# Patient Record
Sex: Male | Born: 1935 | Race: White | Hispanic: No | Marital: Single | Smoking: Former smoker
Health system: Southern US, Community
[De-identification: ages and names within clinical notes are randomized; demographics above are authoritative.]

## PROBLEM LIST (undated history)

## (undated) DIAGNOSIS — I4891 Unspecified atrial fibrillation: Secondary | ICD-10-CM

## (undated) DIAGNOSIS — I639 Cerebral infarction, unspecified: Secondary | ICD-10-CM

## (undated) DIAGNOSIS — C61 Malignant neoplasm of prostate: Secondary | ICD-10-CM

## (undated) DIAGNOSIS — E119 Type 2 diabetes mellitus without complications: Secondary | ICD-10-CM

## (undated) DIAGNOSIS — I10 Essential (primary) hypertension: Secondary | ICD-10-CM

## (undated) DIAGNOSIS — I219 Acute myocardial infarction, unspecified: Secondary | ICD-10-CM

## (undated) DIAGNOSIS — A809 Acute poliomyelitis, unspecified: Secondary | ICD-10-CM

---

## 2020-06-25 ENCOUNTER — Other Ambulatory Visit: Payer: Self-pay

## 2020-06-25 ENCOUNTER — Encounter (HOSPITAL_COMMUNITY): Payer: Self-pay

## 2020-06-25 ENCOUNTER — Emergency Department (HOSPITAL_COMMUNITY): Payer: Medicare Other

## 2020-06-25 ENCOUNTER — Emergency Department (HOSPITAL_COMMUNITY)
Admission: EM | Admit: 2020-06-25 | Discharge: 2020-06-25 | Disposition: A | Discharge status: Home / Self Care | Payer: Medicare Other | Attending: Emergency Medicine | Admitting: Emergency Medicine

## 2020-06-25 DIAGNOSIS — I10 Essential (primary) hypertension: Secondary | ICD-10-CM | POA: Insufficient documentation

## 2020-06-25 DIAGNOSIS — E119 Type 2 diabetes mellitus without complications: Secondary | ICD-10-CM | POA: Diagnosis not present

## 2020-06-25 DIAGNOSIS — Z8673 Personal history of transient ischemic attack (TIA), and cerebral infarction without residual deficits: Secondary | ICD-10-CM | POA: Diagnosis not present

## 2020-06-25 DIAGNOSIS — Z7901 Long term (current) use of anticoagulants: Secondary | ICD-10-CM | POA: Diagnosis not present

## 2020-06-25 DIAGNOSIS — W182XXA Fall in (into) shower or empty bathtub, initial encounter: Secondary | ICD-10-CM | POA: Diagnosis not present

## 2020-06-25 DIAGNOSIS — I4891 Unspecified atrial fibrillation: Secondary | ICD-10-CM | POA: Diagnosis not present

## 2020-06-25 DIAGNOSIS — S80211A Abrasion, right knee, initial encounter: Secondary | ICD-10-CM | POA: Insufficient documentation

## 2020-06-25 DIAGNOSIS — M542 Cervicalgia: Secondary | ICD-10-CM | POA: Diagnosis not present

## 2020-06-25 DIAGNOSIS — Z79899 Other long term (current) drug therapy: Secondary | ICD-10-CM | POA: Insufficient documentation

## 2020-06-25 DIAGNOSIS — M25511 Pain in right shoulder: Secondary | ICD-10-CM | POA: Diagnosis not present

## 2020-06-25 DIAGNOSIS — S0990XA Unspecified injury of head, initial encounter: Secondary | ICD-10-CM | POA: Insufficient documentation

## 2020-06-25 DIAGNOSIS — S8991XA Unspecified injury of right lower leg, initial encounter: Secondary | ICD-10-CM | POA: Diagnosis present

## 2020-06-25 DIAGNOSIS — Z87891 Personal history of nicotine dependence: Secondary | ICD-10-CM | POA: Diagnosis not present

## 2020-06-25 DIAGNOSIS — W19XXXA Unspecified fall, initial encounter: Secondary | ICD-10-CM

## 2020-06-25 DIAGNOSIS — Z8546 Personal history of malignant neoplasm of prostate: Secondary | ICD-10-CM | POA: Insufficient documentation

## 2020-06-25 DIAGNOSIS — I6782 Cerebral ischemia: Secondary | ICD-10-CM | POA: Diagnosis not present

## 2020-06-25 DIAGNOSIS — Y92009 Unspecified place in unspecified non-institutional (private) residence as the place of occurrence of the external cause: Secondary | ICD-10-CM | POA: Insufficient documentation

## 2020-06-25 DIAGNOSIS — T148XXA Other injury of unspecified body region, initial encounter: Secondary | ICD-10-CM

## 2020-06-25 HISTORY — DX: Malignant neoplasm of prostate: C61

## 2020-06-25 HISTORY — DX: Essential (primary) hypertension: I10

## 2020-06-25 HISTORY — DX: Acute myocardial infarction, unspecified: I21.9

## 2020-06-25 HISTORY — DX: Type 2 diabetes mellitus without complications: E11.9

## 2020-06-25 HISTORY — DX: Cerebral infarction, unspecified: I63.9

## 2020-06-25 HISTORY — DX: Acute poliomyelitis, unspecified: A80.9

## 2020-06-25 HISTORY — DX: Unspecified atrial fibrillation: I48.91

## 2020-06-25 MED ORDER — BACITRACIN ZINC 500 UNIT/GM EX OINT
TOPICAL_OINTMENT | Freq: Two times a day (BID) | CUTANEOUS | Status: DC
  Filled 2020-06-25: qty 0.9

## 2020-06-25 NOTE — ED Notes (Signed)
Dressing placed to right knee

## 2020-06-25 NOTE — ED Notes (Signed)
Ambulated to patient to door and back 1 person assist with no difficulty

## 2020-06-25 NOTE — ED Provider Notes (Signed)
[facility] DEPT Provider Note   CSN: [id number] Arrival date & time: 06/25/20  6195     History Chief Complaint  Patient presents with  . Fall    Joel Holloway is a 86 y.o. male with a past medical history of prior stroke, A. fib, prior MI, hypertension, diabetes, currently anticoagulated on Xarelto presenting to the ED with a chief complaint of fall.  Patient states that he was in the shower when he fell.  Ever since he had his stroke about 4 years ago states that he feels off balance intermittently when he looks up.  States that he was washing his hair when he slid and fell onto his right side.  Denies any head injury or loss of consciousness.  He has been having right knee, shoulder and neck pain since the fall.  Denies any headache, vision changes, numbness in arms or legs.  States that he remembers the event fully.  States that he is mostly compliant with his medications and his daughter, Marzetta Board make sure that he takes them regularly.  Denies any preceding or current chest pain, shortness of breath, vomiting, diarrhea. He is ambulatory with walker at baseline. HPI     Past Medical History:  Diagnosis Date  . Atrial fibrillation ([place])   . Diabetes mellitus without complication ([place])   . Hypertension   . Myocardial infarction ([place])   . Polio   . Prostate cancer ([place])   . Stroke [facility])     There are no problems to display for this patient.   History reviewed. No pertinent surgical history.     History reviewed. No pertinent family history.  Social History   Tobacco Use  . Smoking status: Former Research scientist (life sciences)  . 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 Authorizing Provider  alfuzosin (UROXATRAL) 10 MG 24 hr tablet Take 10 mg by mouth daily. 06/18/20   [provider]  dicyclomine (BENTYL) 20 MG tablet Take 20 mg by mouth 2 (two) times daily. 06/17/20    [provider]  FLUoxetine (PROZAC) 10 MG capsule Take 10 mg by mouth daily. 06/17/20   [provider]  glipiZIDE (GLUCOTROL) 5 MG tablet Take 5 mg by mouth daily. 06/18/20   [provider]  levETIRAcetam (KEPPRA) 500 MG tablet Take 500 mg by mouth 2 (two) times daily. 06/18/20   [provider]  levothyroxine (SYNTHROID) 25 MCG tablet Take 25 mcg by mouth every morning. 06/18/20   [provider]  metoprolol succinate (TOPROL-XL) 100 MG 24 hr tablet Take 100 mg by mouth 2 (two) times daily. 06/18/20   [provider]  simvastatin (ZOCOR) 10 MG tablet Take 10 mg by mouth daily. 06/18/20   [provider]  XARELTO 15 MG TABS tablet Take 15 mg by mouth daily. 05/05/20   [provider]    Allergies    Patient has no known allergies.  Review of Systems   Review of Systems  Constitutional: Negative for appetite change, chills and fever.  HENT: Negative for ear pain, rhinorrhea, sneezing and sore throat.   Eyes: Negative for photophobia and visual disturbance.  Respiratory: Negative for cough, chest tightness, shortness of breath and wheezing.   Cardiovascular: Negative for chest pain and palpitations.  Gastrointestinal: Negative for abdominal pain, blood in stool, constipation, diarrhea, nausea and vomiting.  Genitourinary: Negative for dysuria, hematuria and urgency.  Musculoskeletal: Positive for arthralgias, myalgias  and neck pain.  Skin: Negative for rash.  Neurological: Negative for dizziness, weakness and light-headedness.    Physical Exam Updated Vital Signs BP 135/68   Pulse (!) 54   Temp 97.7 F (36.5 C) (Oral)   Resp 12   Ht 5\' 9"  (1.753 m)   Wt 99.8 kg   SpO2 97%   BMI 32.49 kg/m   Physical Exam Vitals and nursing note reviewed.  Constitutional:      General: He is not in acute distress.    Appearance: He is well-developed and well-nourished.  HENT:     Head: Normocephalic and atraumatic.      Nose: Nose normal.  Eyes:     General: No scleral icterus.       Left eye: No discharge.     Extraocular Movements: EOM normal.     Conjunctiva/sclera: Conjunctivae normal.  Cardiovascular:     Rate and Rhythm: Normal rate and regular rhythm.     Pulses: Intact distal pulses.     Heart sounds: Normal heart sounds. No murmur heard. No friction rub. No gallop.   Pulmonary:     Effort: Pulmonary effort is normal. No respiratory distress.     Breath sounds: Normal breath sounds.  Abdominal:     General: Bowel sounds are normal. There is no distension.     Palpations: Abdomen is soft.     Tenderness: There is no abdominal tenderness. There is no guarding.  Musculoskeletal:        General: Tenderness present. No edema. Normal range of motion.     Cervical back: Normal range of motion and neck supple.     Comments: Tenderness to palpation of posterior right shoulder without changes to range of motion or deformities.  No overlying skin changes. Tenderness to palpation of right knee without changes to range of motion.  2+ DP pulses 2+ radial pulses noted bilaterally. Tenderness to palpation of the right paraspinal musculature of the C-spine. No step-off palpated. No visible bruising, edema or temperature change noted. No objective signs of numbness present. No saddle anesthesia. 2+ DP pulses bilaterally. Sensation intact to light touch. Strength 5/5 in bilateral lower extremities.  Skin:    General: Skin is warm and dry.     Findings: Abrasion present. No rash.     Comments: Abrasion noted to right knee.  Neurological:     Mental Status: He is alert.     Cranial Nerves: No cranial nerve deficit.     Sensory: No sensory deficit.     Motor: No abnormal muscle tone.     Coordination: Coordination normal.     Comments: Pupils reactive. No facial asymmetry noted. Cranial nerves appear grossly intact. Sensation intact to light touch on face, BUE and BLE.   Psychiatric:        Mood and Affect:  Mood and affect normal.     ED Results / Procedures / Treatments   Labs (all labs ordered are listed, but only abnormal results are displayed) Labs Reviewed - No data to display  EKG None  Radiology DG Shoulder Right  Result Date: 06/25/2020 CLINICAL DATA:  Fall in shower this morning with shoulder pain, initial encounter EXAM: RIGHT SHOULDER - 2+ VIEW COMPARISON:  None. FINDINGS: Mild degenerative changes of the acromioclavicular joint are noted. No acute fracture or dislocation is seen. No soft tissue abnormality is noted. IMPRESSION: Degenerative change without acute abnormality. Electronically Signed   By: Inez Catalina M.D.   On: 06/25/2020 07:44  CT Head Wo Contrast  Result Date: 06/25/2020 CLINICAL DATA:  Recent fall in shower while on blood thinners EXAM: CT HEAD WITHOUT CONTRAST CT CERVICAL SPINE WITHOUT CONTRAST TECHNIQUE: Multidetector CT imaging of the head and cervical spine was performed following the standard protocol without intravenous contrast. Multiplanar CT image reconstructions of the cervical spine were also generated. COMPARISON:  None. FINDINGS: CT HEAD FINDINGS Brain: Generalized atrophic changes are noted. Chronic white matter ischemic change is seen as well as findings of prior right MCA infarct. No acute hemorrhage, acute infarction or space-occupying mass lesion are seen. Vascular: No hyperdense vessel or unexpected calcification. Skull: Normal. Negative for fracture or focal lesion. Sinuses/Orbits: No acute finding. Other: None CT CERVICAL SPINE FINDINGS Alignment: Within normal limits. Skull base and vertebrae: 7 cervical segments are well visualized. Vertebral body height is well maintained. No acute fracture or acute facet abnormality is noted. Facet hypertrophic changes are noted at multiple levels primarily on the left. Mild osteophytic changes are noted as well. Soft tissues and spinal canal: Surrounding soft tissue structures show vascular calcifications. No  other focal abnormality is noted. Upper chest: Visualized lung apices are within normal limits. Other: None IMPRESSION: CT of the head: Chronic atrophic and ischemic changes without acute abnormality. CT of the cervical spine: Multilevel degenerative changes of the cervical spine without acute abnormality. Electronically Signed   By: Inez Catalina M.D.   On: 06/25/2020 08:06   CT Cervical Spine Wo Contrast  Result Date: 06/25/2020 CLINICAL DATA:  Recent fall in shower while on blood thinners EXAM: CT HEAD WITHOUT CONTRAST CT CERVICAL SPINE WITHOUT CONTRAST TECHNIQUE: Multidetector CT imaging of the head and cervical spine was performed following the standard protocol without intravenous contrast. Multiplanar CT image reconstructions of the cervical spine were also generated. COMPARISON:  None. FINDINGS: CT HEAD FINDINGS Brain: Generalized atrophic changes are noted. Chronic white matter ischemic change is seen as well as findings of prior right MCA infarct. No acute hemorrhage, acute infarction or space-occupying mass lesion are seen. Vascular: No hyperdense vessel or unexpected calcification. Skull: Normal. Negative for fracture or focal lesion. Sinuses/Orbits: No acute finding. Other: None CT CERVICAL SPINE FINDINGS Alignment: Within normal limits. Skull base and vertebrae: 7 cervical segments are well visualized. Vertebral body height is well maintained. No acute fracture or acute facet abnormality is noted. Facet hypertrophic changes are noted at multiple levels primarily on the left. Mild osteophytic changes are noted as well. Soft tissues and spinal canal: Surrounding soft tissue structures show vascular calcifications. No other focal abnormality is noted. Upper chest: Visualized lung apices are within normal limits. Other: None IMPRESSION: CT of the head: Chronic atrophic and ischemic changes without acute abnormality. CT of the cervical spine: Multilevel degenerative changes of the cervical spine without  acute abnormality. Electronically Signed   By: Inez Catalina M.D.   On: 06/25/2020 08:06   DG Knee Complete 4 Views Right  Result Date: 06/25/2020 CLINICAL DATA:  Right knee pain following fall in shower, initial encounter EXAM: RIGHT KNEE - COMPLETE 4+ VIEW COMPARISON:  None. FINDINGS: No acute fracture or dislocation is noted. Small exostosis is noted along the medial aspect of the proximal tibia. No soft tissue abnormality is seen. IMPRESSION: No acute abnormality noted. Electronically Signed   By: Inez Catalina M.D.   On: 06/25/2020 07:45   DG Foot Complete Right  Result Date: 06/25/2020 CLINICAL DATA:  Recent fall with foot pain, initial encounter EXAM: RIGHT FOOT COMPLETE - 3+ VIEW COMPARISON:  None.  FINDINGS: There is flattening of the plantar arch identified. Multiple fixation screws are noted traversing the medial aspect of the tarsal bones and first 2 metatarsals. Fracture of the fixation side plate medially is seen. Fracture of 1 of the larger fixation screws is also noted. These changes may be chronic in nature although no previous images are available. Diffuse tarsal degenerative changes are noted. No acute fracture is seen. IMPRESSION: Postoperative changes with fracture of fixation sideplate and fixation screw although of uncertain chronicity. No acute fracture is noted. Electronically Signed   By: Inez Catalina M.D.   On: 06/25/2020 07:47    Procedures Procedures   Medications Ordered in ED Medications  bacitracin ointment (has no administration in time range)    ED Course  I have reviewed the triage vital signs and the nursing notes.  Pertinent labs & imaging results that were available during my care of the patient were reviewed by me and considered in my medical decision making (see chart for details).    MDM Rules/Calculators/A&P                          86 year old male with past medical history of prior stroke, A. fib, prior MI, hypertension, diabetes currently  anticoagulated on Xarelto presenting to the ED with chief complaint of fall.  States that ever since he had a stroke about 4 years ago he feels off balance intermittently when he looks up.  Was in the shower washing his hair when he had a similar sensation.  This caused him to fall and land on his right side.  Denies any head injury or loss of consciousness.  Reports right knee pain, right foot pain, right neck pain and shoulder pain.  Denies any vision changes, headache, numbness in arms or legs.  Reports compliance with his home medications with the help of his daughter Marzetta Board.  Denies any preceding or current chest pain or shortness of breath.  On exam there is an abrasion on the right knee noted.  Tenderness of the right shoulder without any overlying skin changes or changes to range of motion.  Normal range of motion of right knee noted.  No focal tenderness of foot.  Equal intact distal pulses noted of bilateral upper and lower extremities.  No weakness or numbness noted on exam.  No facial asymmetry.  X-ray of the shoulder, knee without any abnormalities.  X-ray of the foot shows possible fracture of 1 fixation screw although this is unsure if it is acute or chronic.  CT of the head and cervical spine without any abnormalities. No focal tenderness noted of foot.  Ambulated here without difficulty so I feel this screw fracture may be chronic.  We will have him follow-up with orthopedist.  In the meantime we will have him continue his home medications, follow-up with PCP and return for worsening symptoms.   Patient is hemodynamically stable, in NAD, and able to ambulate in the ED. Evaluation does not show pathology that would require ongoing emergent intervention or inpatient treatment. I explained the diagnosis to the patient. Pain has been managed and has no complaints prior to discharge. Patient is comfortable with above plan and is stable for discharge at this time. All questions were answered prior to  disposition. Strict return precautions for returning to the ED were discussed. Encouraged follow up with PCP.   An After Visit Summary was printed and given to the patient.   Portions of this note  were generated with [employer]. Dictation errors may occur despite best attempts at proofreading.  Final Clinical Impression(s) / ED Diagnoses Final diagnoses:  Fall in home, initial encounter  Skin abrasion    Rx / DC Orders ED Discharge Orders    None       Delia Heady, PA-C 06/25/20 6256    Luna Fuse, MD 06/25/20 [phone]

## 2020-06-25 NOTE — Discharge Instructions (Signed)
Continue your home medications as previously prescribed. Follow-up with the orthopedist regarding the broken screw in your right foot which appears chronic. Follow-up with your primary care provider as well. Return to the ER if you start to experience worsening pain, chest pain, shortness of breath, uncontrollable vomiting.

## 2020-06-25 NOTE — ED Triage Notes (Signed)
Patient is taking per EMS: -Alfuzosin -Fluoxetine -Glipizide -Keppra -Metoprolol -Simvastatin -Xarelto

## 2020-06-25 NOTE — ED Triage Notes (Signed)
Patient BIB [facility] from [facility] facility after falling in the shower. Patient is on [place]. Patient denies losing consciousness Patient hurt his right shoulder, right knee, right foot. Patient is diabetic.   EMS vitals CBG 215 127/63 HR 55 99% Room Air RR 18

## 2020-07-03 ENCOUNTER — Emergency Department (HOSPITAL_COMMUNITY)
Admission: EM | Admit: 2020-07-03 | Discharge: 2020-07-03 | Disposition: A | Discharge status: Home / Self Care | Payer: Medicare Other | Attending: Emergency Medicine | Admitting: Emergency Medicine

## 2020-07-03 ENCOUNTER — Emergency Department (HOSPITAL_COMMUNITY): Payer: Medicare Other

## 2020-07-03 ENCOUNTER — Emergency Department (HOSPITAL_BASED_OUTPATIENT_CLINIC_OR_DEPARTMENT_OTHER): Payer: Medicare Other

## 2020-07-03 ENCOUNTER — Encounter (HOSPITAL_COMMUNITY): Payer: Self-pay | Admitting: Emergency Medicine

## 2020-07-03 ENCOUNTER — Other Ambulatory Visit: Payer: Self-pay

## 2020-07-03 DIAGNOSIS — Z8546 Personal history of malignant neoplasm of prostate: Secondary | ICD-10-CM | POA: Diagnosis not present

## 2020-07-03 DIAGNOSIS — W19XXXA Unspecified fall, initial encounter: Secondary | ICD-10-CM | POA: Insufficient documentation

## 2020-07-03 DIAGNOSIS — M7989 Other specified soft tissue disorders: Secondary | ICD-10-CM | POA: Diagnosis not present

## 2020-07-03 DIAGNOSIS — Z7901 Long term (current) use of anticoagulants: Secondary | ICD-10-CM | POA: Insufficient documentation

## 2020-07-03 DIAGNOSIS — S8011XA Contusion of right lower leg, initial encounter: Secondary | ICD-10-CM | POA: Diagnosis not present

## 2020-07-03 DIAGNOSIS — N189 Chronic kidney disease, unspecified: Secondary | ICD-10-CM | POA: Insufficient documentation

## 2020-07-03 DIAGNOSIS — R600 Localized edema: Secondary | ICD-10-CM | POA: Insufficient documentation

## 2020-07-03 DIAGNOSIS — D649 Anemia, unspecified: Secondary | ICD-10-CM

## 2020-07-03 DIAGNOSIS — D631 Anemia in chronic kidney disease: Secondary | ICD-10-CM | POA: Insufficient documentation

## 2020-07-03 DIAGNOSIS — I129 Hypertensive chronic kidney disease with stage 1 through stage 4 chronic kidney disease, or unspecified chronic kidney disease: Secondary | ICD-10-CM | POA: Insufficient documentation

## 2020-07-03 DIAGNOSIS — S8991XA Unspecified injury of right lower leg, initial encounter: Secondary | ICD-10-CM | POA: Diagnosis present

## 2020-07-03 DIAGNOSIS — Z87891 Personal history of nicotine dependence: Secondary | ICD-10-CM | POA: Insufficient documentation

## 2020-07-03 DIAGNOSIS — Z79899 Other long term (current) drug therapy: Secondary | ICD-10-CM | POA: Diagnosis not present

## 2020-07-03 DIAGNOSIS — E1122 Type 2 diabetes mellitus with diabetic chronic kidney disease: Secondary | ICD-10-CM | POA: Insufficient documentation

## 2020-07-03 LAB — BASIC METABOLIC PANEL
Anion gap: 8 (ref 5–15)
BUN: 31 mg/dL — ABNORMAL HIGH (ref 8–23)
CO2: 23 mmol/L (ref 22–32)
Calcium: 8.2 mg/dL — ABNORMAL LOW (ref 8.9–10.3)
Chloride: 108 mmol/L (ref 98–111)
Creatinine, Ser: 2.01 mg/dL — ABNORMAL HIGH (ref 0.61–1.24)
GFR, Estimated: 32 mL/min — ABNORMAL LOW (ref >60)
Glucose, Bld: 164 mg/dL — ABNORMAL HIGH (ref 70–99)
Potassium: 4.2 mmol/L (ref 3.5–5.1)
Sodium: 139 mmol/L (ref 135–145)

## 2020-07-03 LAB — CBC WITH DIFFERENTIAL/PLATELET
Abs Immature Granulocytes: 0.08 10*3/uL — ABNORMAL HIGH (ref 0.00–0.07)
Basophils Absolute: 0.1 10*3/uL (ref 0.0–0.1)
Basophils Relative: 1 %
Eosinophils Absolute: 0.4 10*3/uL (ref 0.0–0.5)
Eosinophils Relative: 4 %
HCT: 31 % — ABNORMAL LOW (ref 39.0–52.0)
Hemoglobin: 9.6 g/dL — ABNORMAL LOW (ref 13.0–17.0)
Immature Granulocytes: 1 %
Lymphocytes Relative: 13 %
Lymphs Abs: 1.4 10*3/uL (ref 0.7–4.0)
MCH: 28.1 pg (ref 26.0–34.0)
MCHC: 31 g/dL (ref 30.0–36.0)
MCV: 90.6 fL (ref 80.0–100.0)
Monocytes Absolute: 1.6 10*3/uL — ABNORMAL HIGH (ref 0.1–1.0)
Monocytes Relative: 16 %
Neutro Abs: 6.7 10*3/uL (ref 1.7–7.7)
Neutrophils Relative %: 65 %
Platelets: 514 10*3/uL — ABNORMAL HIGH (ref 150–400)
RBC: 3.42 MIL/uL — ABNORMAL LOW (ref 4.22–5.81)
RDW: 17.9 % — ABNORMAL HIGH (ref 11.5–15.5)
WBC: 10.3 10*3/uL (ref 4.0–10.5)
nRBC: 0 % (ref 0.0–0.2)

## 2020-07-03 MED ORDER — HYDROCODONE-ACETAMINOPHEN 5-325 MG PO TABS
1.0000 | ORAL_TABLET | Freq: Once | ORAL | Status: AC
Start: 2020-07-03 — End: 2020-07-03
  Administered 2020-07-03: 1 via ORAL
  Filled 2020-07-03: qty 1

## 2020-07-03 MED ORDER — HYDROCODONE-ACETAMINOPHEN 5-325 MG PO TABS: 1.0000 | ORAL_TABLET | Freq: Four times a day (QID) | ORAL | 0 refills | Status: DC | PRN

## 2020-07-03 NOTE — ED Provider Notes (Signed)
Central DEPT Provider Note   CSN: [id number] Arrival date & time: 07/03/20  1016     History Chief complaint: Leg swelling  General Wearing is a 86 y.o. male.  HPI   Patient presents to the ED for evaluation of right leg swelling.  Patient states he fell injuring his right leg last week.  He subsequently developed swelling in his right lower leg below the knee.  It is tender to the touch.  He is not having any trouble with fevers or chills.  No vomiting or diarrhea.  No chest pain or shortness of breath.  Patient is currently on Xarelto for atrial fibrillation.  Patient is concerned they may have developed a DVT in his leg so he came to ED for that reason  Past Medical History:  Diagnosis Date   Atrial fibrillation ([place])    Diabetes mellitus without complication ([place])    Hypertension    Myocardial infarction [facility])    Polio    Prostate cancer ([place])    Stroke ([place])     There are no problems to display for this patient.   History reviewed. No pertinent surgical history.     History reviewed. No pertinent family history.  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 Authorizing Provider  alfuzosin (UROXATRAL) 10 MG 24 hr tablet Take 10 mg by mouth every evening. 06/18/20  Yes [provider]  dicyclomine (BENTYL) 20 MG tablet Take 20 mg by mouth daily. 06/17/20  Yes [provider]  FLUoxetine (PROZAC) 10 MG capsule Take 10 mg by mouth daily. 06/17/20  Yes [provider]  glipiZIDE (GLUCOTROL) 5 MG tablet Take 5 mg by mouth daily. 06/18/20  Yes [provider]  HYDROcodone-acetaminophen (NORCO/VICODIN) 5-325 MG tablet Take 1 tablet by mouth every 6 (six) hours as needed for severe pain. 07/03/20  Yes Dorie Rank, MD  levETIRAcetam (KEPPRA) 500 MG tablet  Take 500 mg by mouth 2 (two) times daily. 06/18/20  Yes [provider]  levothyroxine (SYNTHROID) 25 MCG tablet Take 25 mcg by mouth every morning. 06/18/20  Yes [provider]  metoprolol succinate (TOPROL-XL) 100 MG 24 hr tablet Take 100 mg by mouth daily. 06/18/20  Yes [provider]  Probiotic Product (PROBIOTIC PO) Take 1 capsule by mouth daily.   Yes [provider]  simvastatin (ZOCOR) 10 MG tablet Take 10 mg by mouth daily. 06/18/20  Yes [provider]  triamcinolone (KENALOG) 0.1 % Apply topically 2 (two) times daily. 06/09/20  Yes [provider]  XARELTO 15 MG TABS tablet Take 15 mg by mouth daily. 05/05/20  Yes [provider]    Allergies    Patient has no known allergies.  Review of Systems   Review of Systems  All other systems reviewed and are negative.   Physical Exam Updated Vital Signs BP 118/67    Pulse 68    Temp 98 F (36.7 C) (Oral)    Resp 16    SpO2 92%   Physical Exam Vitals and nursing note reviewed.  Constitutional:      General: He is not in acute distress.    Appearance: He is well-developed.  HENT:     Head: Normocephalic and atraumatic.     Right Ear: External ear normal.     Left Ear:  External ear normal.  Eyes:     General: No scleral icterus.       Right eye: No discharge.        Left eye: No discharge.     Conjunctiva/sclera: Conjunctivae normal.  Neck:     Trachea: No tracheal deviation.  Cardiovascular:     Rate and Rhythm: Normal rate and regular rhythm.  Pulmonary:     Effort: Pulmonary effort is normal. No respiratory distress.     Breath sounds: Normal breath sounds. No stridor. No wheezing or rales.  Abdominal:     General: Bowel sounds are normal. There is no distension.     Palpations: Abdomen is soft.     Tenderness: There is no abdominal tenderness. There is no guarding or rebound.  Musculoskeletal:        General: Tenderness present.     Cervical back: Neck  supple.     Right lower leg: Edema present.     Comments: Tense edema below the knee on the right lower extremity, cap refill is brisk distally, sensation intact in the foot, no erythema or increased warmth to the calf  Skin:    General: Skin is warm and dry.     Findings: No rash.  Neurological:     Mental Status: He is alert.     Cranial Nerves: No cranial nerve deficit (no facial droop, extraocular movements intact, no slurred speech).     Sensory: No sensory deficit.     Motor: No abnormal muscle tone or seizure activity.     Coordination: Coordination normal.     ED Results / Procedures / Treatments   Labs (all labs ordered are listed, but only abnormal results are displayed) Labs Reviewed  CBC WITH DIFFERENTIAL/PLATELET - Abnormal; Notable for the following components:      Result Value   RBC 3.42 (*)    Hemoglobin 9.6 (*)    HCT 31.0 (*)    RDW 17.9 (*)    Platelets 514 (*)    Monocytes Absolute 1.6 (*)    Abs Immature Granulocytes 0.08 (*)    All other components within normal limits  BASIC METABOLIC PANEL - Abnormal; Notable for the following components:   Glucose, Bld 164 (*)    BUN 31 (*)    Creatinine, Ser 2.01 (*)    Calcium 8.2 (*)    GFR, Estimated 32 (*)    All other components within normal limits    EKG None  Radiology DG Tibia/Fibula Right  Result Date: 07/03/2020 CLINICAL DATA:  86 year old male status post fall last week with continued pain and swelling. EXAM: RIGHT TIBIA AND FIBULA - 2 VIEW COMPARISON:  None. FINDINGS: Sequelae of previous external fixator device at the tibia. Minimally visible ORIF hardware in cannulated screws in the posterior foot. Preserved alignment at the right knee and ankle. Right tibia and fibula are intact. There is a small medial proximal tibia osteochondroma, inconsequential in this age group. No discrete soft tissue injury. IMPRESSION: 1. No acute fracture or dislocation identified about the right tib-fib. 2. Sequelae of  previous tibia ex-fix device. Minimally visible right foot ORIF hardware. Electronically Signed   By: Genevie Ann M.D.   On: 07/03/2020 11:02   VAS [place] LOWER EXTREMITY VENOUS (DVT) (ONLY [facility] & [facility] 7a-7p)  Result Date: 07/03/2020  Lower Venous DVT Study Indications: Swelling.  Risk Factors: Cancer Prostate Trauma fall x1 week. Anticoagulation: Xarelto. Comparison Study: No previous Performing Technologist: Vonzell Schlatter RVT  Examination Guidelines:  A complete evaluation includes B-mode imaging, spectral Doppler, color Doppler, and power Doppler as needed of all accessible portions of each vessel. Bilateral testing is considered an integral part of a complete examination. Limited examinations for reoccurring indications may be performed as noted. The reflux portion of the exam is performed with the patient in reverse Trendelenburg.  +---------+---------------+---------+-----------+----------+--------------+  RIGHT     Compressibility Phasicity Spontaneity Properties Thrombus Aging  +---------+---------------+---------+-----------+----------+--------------+  CFV       Full                                                             +---------+---------------+---------+-----------+----------+--------------+  SFJ       Full                                                             +---------+---------------+---------+-----------+----------+--------------+  FV Prox   Full                                                             +---------+---------------+---------+-----------+----------+--------------+  FV Mid    Full                                                             +---------+---------------+---------+-----------+----------+--------------+  FV Distal Full                                                             +---------+---------------+---------+-----------+----------+--------------+  PFV       Full                                                              +---------+---------------+---------+-----------+----------+--------------+  POP                                                        Not visualized  +---------+---------------+---------+-----------+----------+--------------+  PTV       Full                                             Prox not  seen   +---------+---------------+---------+-----------+----------+--------------+  PERO      Full                                             Prox not seen   +---------+---------------+---------+-----------+----------+--------------+  Summary: RIGHT: - There is no evidence of deep vein thrombosis in the lower extremity. However, portions of this examination were limited- see technologist comments above.  - No cystic structure found in the popliteal fossa. - Large hematome behind knee and at proximal calf not. Unable to get true measurements due to size.   *See table(s) above for measurements and observations.    Preliminary     Procedures Ultrasound ED Soft Tissue  Date/Time: 07/03/2020 11:24 AM Performed by: Dorie Rank, MD Authorized by: Dorie Rank, MD   Procedure details:    Indications: limb pain     Transverse view:  Visualized   Longitudinal view:  Visualized   Images: archived   Location:    Location: lower extremity     Side:  Right Findings:     no abscess present    no cellulitis present    no foreign body present Comments:     Suspect large hematoma, recent trauma  (No charge for study, formal US ordered)     Medications Ordered in ED Medications  HYDROcodone-acetaminophen (NORCO/VICODIN) 5-325 MG per tablet 1 tablet (1 tablet Oral Given 07/03/20 1220)    ED Course  I have reviewed the triage vital signs and the nursing notes.  Pertinent labs & imaging results that were available during my care of the patient were reviewed by me and considered in my medical decision making (see chart for details).  Clinical Course as of 07/03/20 1435  Sat Jul 03, 2020  1129 Imaging tests  reviewed from prior visit.  Patient stated x-ray today is negative.  Bedside ultrasound suggest hematoma. [JK]  1216 No prior labs available.  Renal insufficiency noted [JK]  1216 Hemoglobin decreased at 9.6.  No prior labs for comparison [JK]  1346 Ultrasound negative for DVT [JK]    Clinical Course User Index [JK] Dorie Rank, MD   MDM Rules/Calculators/A&P                          Patient presented to the ED for evaluation of leg swelling.  Patient was concerned about the possibility of a DVT.  Patient's physical exam was suggestive of hematoma.  He has normal perfusion distally.  No signs of compartment syndrome.  Bedside ultrasound performed and patient did appear to have a large hematoma.  He did have a formal Doppler study and that was negative for DVT.  Hematoma was confirmed.  Patient does have anemia and CKD.  Is possible his anemia could be worse from this hematoma but we do not have any baseline.  Patient's not having any dizziness or weakness.  Patient family states patient does have a history of chronic kidney disease.  They do not know of his baseline creatinine but is not having any other symptoms and I think it safe to have him follow-up with his doctor.  Patient is set to see an orthopedic doctor this coming week.  He may benefit from evacuation of the hematoma but there is no emergent need and he would need to discontinue his Xarelto.  I will have the patient stop  taking his Xarelto.  Follow-up with orthopedics.  Speak with his doctor this coming week when it would be safe to resume. Final Clinical Impression(s) / ED Diagnoses Final diagnoses:  Hematoma of right lower extremity, initial encounter  Chronic kidney disease, unspecified CKD stage  Anemia, unspecified type    Rx / DC Orders ED Discharge Orders         Ordered    HYDROcodone-acetaminophen (NORCO/VICODIN) 5-325 MG tablet  Every 6 hours PRN        07/03/20 1433           Dorie Rank, MD 07/03/20 1437

## 2020-07-03 NOTE — ED Triage Notes (Addendum)
Patient BIBA from [facility]. Patient had an fall last week and has noted right leg swelling since then. Denies hitting head or LOC. EMS reports the leg is warm and swollen. Hx a. Fib, DM2, prostate cancer, CVA  BP 113/55 SpO2 98% RA RR 16 CBG 182 HR 82

## 2020-07-03 NOTE — Progress Notes (Signed)
Right lower extremity venous study completed.   Results given to Dr Tomi Bamberger   Please see CV Proc for preliminary results.   Vonzell Schlatter, RVT

## 2020-07-03 NOTE — Progress Notes (Signed)
.  Transition of Care [facility]) - Emergency Department Mini Assessment   Patient Details  Name: Natalie Leclaire MRN: [id number] Date of Birth: 1934/05/27  Transition of Care [facility]) CM/SW Contact:    Erenest Rasher, RN Phone Number: [phone] 07/03/2020, 4:38 PM   Clinical Narrative:  TOC CM spoke to pt's dtr, Jeani Hawking. Pt lives independently at [facility]. Has RW at home. Pt had 2 ED visits in month of March. ED provider updated and [facility] ordered. Offered choice for [facility]. Dtr agreeable to agency that will accept insurance. Contacted [facility] and they will work on start of care for tomorrow or Monday.    ED Mini Assessment: What brought you to the Emergency Department? : leg swelling  Barriers to Discharge: No Barriers Identified  Barrier interventions: arranged [place] of departure: Ambulance  Interventions which prevented an admission or readmission: [place] or Services    Patient Contact and Communications Key Contact 1: Ethel Rana   Spoke with: daughter Contact Date: 07/03/20,   Contact time: 1637 Contact Phone Number: [phone]    Patient states their goals for this hospitalization and ongoing recovery are:: live independently CMS Medicare.gov Compare Post Acute Care list provided to:: Patient Represenative (must comment) (daughter-Lynn) Choice offered to / list presented to : Adult Children  Admission diagnosis:  swelling rt leg There are no problems to display for this patient.  PCP:  [name], DO Pharmacy:   [facility] [place] [place] [address] Phone: [phone] Fax: [phone]

## 2020-07-03 NOTE — Discharge Instructions (Signed)
Try to keep your leg elevated to help reduce swelling.  Take the medications for pain as needed.  Follow-up with your orthopedic doctor this week to discuss further treatment of the hematoma.   Stop taking your Xarelto this week and discuss with your doctor when to restart that medication.  Follow-up with your doctor to review the laboratory tests from today.

## 2020-07-03 NOTE — ED Notes (Signed)
[facility] called to transport patient back to [place]. Patient's family member is arranging to have someone to meet [facility] when patient arrives home.

## 2020-09-20 ENCOUNTER — Emergency Department (HOSPITAL_COMMUNITY): Payer: Medicare Other

## 2020-09-20 ENCOUNTER — Inpatient Hospital Stay (HOSPITAL_COMMUNITY)
Admission: EM | Admit: 2020-09-20 | Discharge: 2020-10-22 | DRG: 177 | Disposition: E | Payer: Medicare Other | Attending: Internal Medicine | Admitting: Internal Medicine

## 2020-09-20 ENCOUNTER — Other Ambulatory Visit: Payer: Self-pay

## 2020-09-20 ENCOUNTER — Encounter (HOSPITAL_COMMUNITY): Payer: Self-pay

## 2020-09-20 DIAGNOSIS — Z7901 Long term (current) use of anticoagulants: Secondary | ICD-10-CM

## 2020-09-20 DIAGNOSIS — Z6832 Body mass index (BMI) 32.0-32.9, adult: Secondary | ICD-10-CM

## 2020-09-20 DIAGNOSIS — F05 Delirium due to known physiological condition: Secondary | ICD-10-CM | POA: Diagnosis not present

## 2020-09-20 DIAGNOSIS — I4891 Unspecified atrial fibrillation: Secondary | ICD-10-CM | POA: Diagnosis present

## 2020-09-20 DIAGNOSIS — Z8546 Personal history of malignant neoplasm of prostate: Secondary | ICD-10-CM | POA: Diagnosis not present

## 2020-09-20 DIAGNOSIS — U071 COVID-19: Secondary | ICD-10-CM | POA: Diagnosis present

## 2020-09-20 DIAGNOSIS — I252 Old myocardial infarction: Secondary | ICD-10-CM

## 2020-09-20 DIAGNOSIS — J9601 Acute respiratory failure with hypoxia: Secondary | ICD-10-CM | POA: Diagnosis present

## 2020-09-20 DIAGNOSIS — Z87891 Personal history of nicotine dependence: Secondary | ICD-10-CM

## 2020-09-20 DIAGNOSIS — I480 Paroxysmal atrial fibrillation: Secondary | ICD-10-CM | POA: Diagnosis present

## 2020-09-20 DIAGNOSIS — Z66 Do not resuscitate: Secondary | ICD-10-CM | POA: Diagnosis present

## 2020-09-20 DIAGNOSIS — I129 Hypertensive chronic kidney disease with stage 1 through stage 4 chronic kidney disease, or unspecified chronic kidney disease: Secondary | ICD-10-CM | POA: Diagnosis present

## 2020-09-20 DIAGNOSIS — E1122 Type 2 diabetes mellitus with diabetic chronic kidney disease: Secondary | ICD-10-CM | POA: Diagnosis present

## 2020-09-20 DIAGNOSIS — E785 Hyperlipidemia, unspecified: Secondary | ICD-10-CM | POA: Diagnosis present

## 2020-09-20 DIAGNOSIS — N1832 Chronic kidney disease, stage 3b: Secondary | ICD-10-CM | POA: Diagnosis present

## 2020-09-20 DIAGNOSIS — R0902 Hypoxemia: Secondary | ICD-10-CM

## 2020-09-20 DIAGNOSIS — Z8673 Personal history of transient ischemic attack (TIA), and cerebral infarction without residual deficits: Secondary | ICD-10-CM

## 2020-09-20 DIAGNOSIS — R54 Age-related physical debility: Secondary | ICD-10-CM | POA: Diagnosis present

## 2020-09-20 DIAGNOSIS — I2510 Atherosclerotic heart disease of native coronary artery without angina pectoris: Secondary | ICD-10-CM | POA: Diagnosis present

## 2020-09-20 DIAGNOSIS — E119 Type 2 diabetes mellitus without complications: Secondary | ICD-10-CM

## 2020-09-20 DIAGNOSIS — J1282 Pneumonia due to coronavirus disease 2019: Secondary | ICD-10-CM | POA: Diagnosis present

## 2020-09-20 DIAGNOSIS — I639 Cerebral infarction, unspecified: Secondary | ICD-10-CM | POA: Diagnosis present

## 2020-09-20 DIAGNOSIS — Z79890 Hormone replacement therapy (postmenopausal): Secondary | ICD-10-CM

## 2020-09-20 DIAGNOSIS — Z515 Encounter for palliative care: Secondary | ICD-10-CM | POA: Diagnosis not present

## 2020-09-20 DIAGNOSIS — Z7984 Long term (current) use of oral hypoglycemic drugs: Secondary | ICD-10-CM

## 2020-09-20 DIAGNOSIS — G9341 Metabolic encephalopathy: Secondary | ICD-10-CM | POA: Diagnosis not present

## 2020-09-20 DIAGNOSIS — I219 Acute myocardial infarction, unspecified: Secondary | ICD-10-CM | POA: Insufficient documentation

## 2020-09-20 DIAGNOSIS — I10 Essential (primary) hypertension: Secondary | ICD-10-CM | POA: Diagnosis present

## 2020-09-20 DIAGNOSIS — Z79899 Other long term (current) drug therapy: Secondary | ICD-10-CM | POA: Diagnosis not present

## 2020-09-20 DIAGNOSIS — R410 Disorientation, unspecified: Secondary | ICD-10-CM | POA: Diagnosis not present

## 2020-09-20 DIAGNOSIS — C61 Malignant neoplasm of prostate: Secondary | ICD-10-CM | POA: Diagnosis present

## 2020-09-20 LAB — COMPREHENSIVE METABOLIC PANEL
ALT: 23 U/L (ref 0–44)
AST: 35 U/L (ref 15–41)
Albumin: 2.8 g/dL — ABNORMAL LOW (ref 3.5–5.0)
Alkaline Phosphatase: 65 U/L (ref 38–126)
Anion gap: 13 (ref 5–15)
BUN: 47 mg/dL — ABNORMAL HIGH (ref 8–23)
CO2: 19 mmol/L — ABNORMAL LOW (ref 22–32)
Calcium: 8.3 mg/dL — ABNORMAL LOW (ref 8.9–10.3)
Chloride: 103 mmol/L (ref 98–111)
Creatinine, Ser: 2.2 mg/dL — ABNORMAL HIGH (ref 0.61–1.24)
GFR, Estimated: 29 mL/min — ABNORMAL LOW (ref >60)
Glucose, Bld: 158 mg/dL — ABNORMAL HIGH (ref 70–99)
Potassium: 3.8 mmol/L (ref 3.5–5.1)
Sodium: 135 mmol/L (ref 135–145)
Total Bilirubin: 1.2 mg/dL (ref 0.3–1.2)
Total Protein: 6.9 g/dL (ref 6.5–8.1)

## 2020-09-20 LAB — CBC WITH DIFFERENTIAL/PLATELET
Abs Immature Granulocytes: 0 10*3/uL (ref 0.00–0.07)
Basophils Absolute: 0 10*3/uL (ref 0.0–0.1)
Basophils Relative: 0 %
Eosinophils Absolute: 0 10*3/uL (ref 0.0–0.5)
Eosinophils Relative: 0 %
HCT: 38.3 % — ABNORMAL LOW (ref 39.0–52.0)
Hemoglobin: 12.1 g/dL — ABNORMAL LOW (ref 13.0–17.0)
Immature Granulocytes: 0 %
Lymphocytes Relative: 5 %
Lymphs Abs: 0.5 10*3/uL — ABNORMAL LOW (ref 0.7–4.0)
MCH: 27.3 pg (ref 26.0–34.0)
MCHC: 31.6 g/dL (ref 30.0–36.0)
MCV: 86.3 fL (ref 80.0–100.0)
Monocytes Absolute: 0.8 10*3/uL (ref 0.1–1.0)
Monocytes Relative: 7 %
Neutro Abs: 9.3 10*3/uL — ABNORMAL HIGH (ref 1.7–7.7)
Neutrophils Relative %: 88 %
Platelets: 603 10*3/uL — ABNORMAL HIGH (ref 150–400)
RBC: 4.44 MIL/uL (ref 4.22–5.81)
RDW: 16.7 % — ABNORMAL HIGH (ref 11.5–15.5)
WBC: 10.7 10*3/uL — ABNORMAL HIGH (ref 4.0–10.5)
nRBC: 0 % (ref 0.0–0.2)

## 2020-09-20 LAB — LACTIC ACID, PLASMA
Lactic Acid, Venous: 1.6 mmol/L (ref 0.5–1.9)
Lactic Acid, Venous: 1.5 mmol/L (ref 0.5–1.9)

## 2020-09-20 LAB — CBG MONITORING, ED: Glucose-Capillary: 158 mg/dL — ABNORMAL HIGH (ref 70–99)

## 2020-09-20 LAB — GLUCOSE, CAPILLARY
Glucose-Capillary: 162 mg/dL — ABNORMAL HIGH (ref 70–99)
Glucose-Capillary: 204 mg/dL — ABNORMAL HIGH (ref 70–99)
Glucose-Capillary: 183 mg/dL — ABNORMAL HIGH (ref 70–99)

## 2020-09-20 LAB — FERRITIN: Ferritin: 763 ng/mL — ABNORMAL HIGH (ref 24–336)

## 2020-09-20 LAB — PROTIME-INR
INR: 1.5 — ABNORMAL HIGH (ref 0.8–1.2)
Prothrombin Time: 17.8 seconds — ABNORMAL HIGH (ref 11.4–15.2)

## 2020-09-20 LAB — D-DIMER, QUANTITATIVE: D-Dimer, Quant: 5.56 ug/mL-FEU — ABNORMAL HIGH (ref 0.00–0.50)

## 2020-09-20 LAB — TRIGLYCERIDES: Triglycerides: 79 mg/dL (ref <150)

## 2020-09-20 LAB — C-REACTIVE PROTEIN: CRP: 28.7 mg/dL — ABNORMAL HIGH (ref <1.0)

## 2020-09-20 LAB — LACTATE DEHYDROGENASE: LDH: 259 U/L — ABNORMAL HIGH (ref 98–192)

## 2020-09-20 LAB — FIBRINOGEN: Fibrinogen: >800 mg/dL — ABNORMAL HIGH (ref 210–475)

## 2020-09-20 LAB — PROCALCITONIN: Procalcitonin: 0.72 ng/mL

## 2020-09-20 MED ORDER — SODIUM CHLORIDE 0.9 % IV SOLN
100.0000 mg | Freq: Every day | INTRAVENOUS | Status: AC
  Administered 2020-09-21 – 2020-09-24 (×4): 100 mg via INTRAVENOUS
  Filled 2020-09-20 (×5): qty 20

## 2020-09-20 MED ORDER — SIMVASTATIN 20 MG PO TABS
10.0000 mg | ORAL_TABLET | Freq: Every evening | ORAL | Status: DC
  Administered 2020-09-20 – 2020-09-25 (×6): 10 mg via ORAL
  Filled 2020-09-20 (×6): qty 1

## 2020-09-20 MED ORDER — LEVOTHYROXINE SODIUM 25 MCG PO TABS
25.0000 ug | ORAL_TABLET | Freq: Every day | ORAL | Status: DC
  Administered 2020-09-21 – 2020-09-26 (×6): 25 ug via ORAL
  Filled 2020-09-20 (×6): qty 1

## 2020-09-20 MED ORDER — SODIUM CHLORIDE 0.9% FLUSH: 3.0000 mL | INTRAVENOUS | Status: DC | PRN

## 2020-09-20 MED ORDER — ASCORBIC ACID 500 MG PO TABS
500.0000 mg | ORAL_TABLET | Freq: Every day | ORAL | Status: DC
  Administered 2020-09-20 – 2020-09-26 (×7): 500 mg via ORAL
  Filled 2020-09-20 (×7): qty 1

## 2020-09-20 MED ORDER — METOPROLOL SUCCINATE ER 25 MG PO TB24
100.0000 mg | ORAL_TABLET | Freq: Every morning | ORAL | Status: DC
  Administered 2020-09-21 – 2020-09-26 (×6): 100 mg via ORAL
  Filled 2020-09-20 (×6): qty 4

## 2020-09-20 MED ORDER — ALFUZOSIN HCL ER 10 MG PO TB24
10.0000 mg | ORAL_TABLET | Freq: Every morning | ORAL | Status: DC
  Administered 2020-09-20 – 2020-09-26 (×7): 10 mg via ORAL
  Filled 2020-09-20 (×7): qty 1

## 2020-09-20 MED ORDER — ALBUTEROL SULFATE HFA 108 (90 BASE) MCG/ACT IN AERS
2.0000 | INHALATION_SPRAY | Freq: Once | RESPIRATORY_TRACT | Status: AC
  Administered 2020-09-20: 2 via RESPIRATORY_TRACT
  Filled 2020-09-20: qty 6.7

## 2020-09-20 MED ORDER — OXYCODONE HCL 5 MG PO TABS
5.0000 mg | ORAL_TABLET | ORAL | Status: DC | PRN
  Administered 2020-09-24: 5 mg via ORAL
  Filled 2020-09-20: qty 1

## 2020-09-20 MED ORDER — SODIUM CHLORIDE 0.9 % IV SOLN: 250.0000 mL | INTRAVENOUS | Status: DC | PRN

## 2020-09-20 MED ORDER — ONDANSETRON HCL 4 MG PO TABS
4.0000 mg | ORAL_TABLET | Freq: Four times a day (QID) | ORAL | Status: DC | PRN
  Administered 2020-09-21: 4 mg via ORAL
  Filled 2020-09-20: qty 1

## 2020-09-20 MED ORDER — POLYETHYLENE GLYCOL 3350 17 G PO PACK: 17.0000 g | PACK | Freq: Every day | ORAL | Status: DC | PRN

## 2020-09-20 MED ORDER — DOCUSATE SODIUM 100 MG PO CAPS
100.0000 mg | ORAL_CAPSULE | Freq: Two times a day (BID) | ORAL | Status: DC
  Administered 2020-09-20 – 2020-09-25 (×8): 100 mg via ORAL
  Filled 2020-09-20 (×6): qty 1

## 2020-09-20 MED ORDER — GUAIFENESIN-DM 100-10 MG/5ML PO SYRP
10.0000 mL | ORAL_SOLUTION | ORAL | Status: DC | PRN
  Administered 2020-09-20 – 2020-09-25 (×4): 10 mL via ORAL
  Filled 2020-09-20 (×4): qty 10

## 2020-09-20 MED ORDER — METHYLPREDNISOLONE SODIUM SUCC 125 MG IJ SOLR
50.0000 mg | Freq: Two times a day (BID) | INTRAMUSCULAR | Status: DC
  Administered 2020-09-20 – 2020-09-22 (×5): 50 mg via INTRAVENOUS
  Filled 2020-09-20 (×5): qty 2

## 2020-09-20 MED ORDER — INSULIN ASPART 100 UNIT/ML IJ SOLN
0.0000 [IU] | Freq: Every day | INTRAMUSCULAR | Status: DC
  Administered 2020-09-21: 2 [IU] via SUBCUTANEOUS
  Administered 2020-09-22: 3 [IU] via SUBCUTANEOUS
  Administered 2020-09-23: 4 [IU] via SUBCUTANEOUS
  Administered 2020-09-24: 3 [IU] via SUBCUTANEOUS

## 2020-09-20 MED ORDER — SODIUM CHLORIDE 0.9% FLUSH
3.0000 mL | Freq: Two times a day (BID) | INTRAVENOUS | Status: DC
  Administered 2020-09-20 – 2020-09-26 (×11): 3 mL via INTRAVENOUS

## 2020-09-20 MED ORDER — BISACODYL 5 MG PO TBEC: 5.0000 mg | DELAYED_RELEASE_TABLET | Freq: Every day | ORAL | Status: DC | PRN

## 2020-09-20 MED ORDER — DICYCLOMINE HCL 20 MG PO TABS
20.0000 mg | ORAL_TABLET | Freq: Two times a day (BID) | ORAL | Status: DC
  Administered 2020-09-20 – 2020-09-26 (×12): 20 mg via ORAL
  Filled 2020-09-20 (×14): qty 1

## 2020-09-20 MED ORDER — ACETAMINOPHEN 325 MG PO TABS
650.0000 mg | ORAL_TABLET | Freq: Four times a day (QID) | ORAL | Status: DC | PRN
  Administered 2020-09-20 – 2020-09-21 (×2): 650 mg via ORAL
  Filled 2020-09-20 (×2): qty 2

## 2020-09-20 MED ORDER — SODIUM CHLORIDE 0.9 % IV SOLN
500.0000 mg | INTRAVENOUS | Status: AC
  Administered 2020-09-20 – 2020-09-24 (×5): 500 mg via INTRAVENOUS
  Filled 2020-09-20 (×5): qty 500

## 2020-09-20 MED ORDER — LEVETIRACETAM 500 MG PO TABS
500.0000 mg | ORAL_TABLET | Freq: Every morning | ORAL | Status: DC
  Administered 2020-09-20 – 2020-09-26 (×7): 500 mg via ORAL
  Filled 2020-09-20 (×7): qty 1

## 2020-09-20 MED ORDER — ACETAMINOPHEN 325 MG PO TABS
650.0000 mg | ORAL_TABLET | Freq: Once | ORAL | Status: AC
  Administered 2020-09-20: 650 mg via ORAL
  Filled 2020-09-20: qty 2

## 2020-09-20 MED ORDER — ALBUTEROL SULFATE HFA 108 (90 BASE) MCG/ACT IN AERS
2.0000 | INHALATION_SPRAY | RESPIRATORY_TRACT | Status: DC | PRN
  Administered 2020-09-21 – 2020-09-26 (×4): 2 via RESPIRATORY_TRACT
  Filled 2020-09-20: qty 6.7

## 2020-09-20 MED ORDER — ONDANSETRON HCL 4 MG/2ML IJ SOLN: 4.0000 mg | Freq: Four times a day (QID) | INTRAMUSCULAR | Status: DC | PRN

## 2020-09-20 MED ORDER — RIVAROXABAN 15 MG PO TABS
15.0000 mg | ORAL_TABLET | Freq: Every morning | ORAL | Status: DC
  Administered 2020-09-20 – 2020-09-25 (×6): 15 mg via ORAL
  Filled 2020-09-20 (×6): qty 1

## 2020-09-20 MED ORDER — ZINC SULFATE 220 (50 ZN) MG PO CAPS
220.0000 mg | ORAL_CAPSULE | Freq: Every day | ORAL | Status: DC
  Administered 2020-09-20 – 2020-09-26 (×7): 220 mg via ORAL
  Filled 2020-09-20 (×7): qty 1

## 2020-09-20 MED ORDER — PREDNISONE 20 MG PO TABS: 50.0000 mg | ORAL_TABLET | Freq: Every day | ORAL | Status: DC

## 2020-09-20 MED ORDER — FLEET ENEMA 7-19 GM/118ML RE ENEM: 1.0000 | ENEMA | Freq: Once | RECTAL | Status: DC | PRN

## 2020-09-20 MED ORDER — SODIUM CHLORIDE 0.9 % IV SOLN
200.0000 mg | Freq: Once | INTRAVENOUS | Status: AC
  Administered 2020-09-20: 200 mg via INTRAVENOUS
  Filled 2020-09-20: qty 40

## 2020-09-20 MED ORDER — SODIUM CHLORIDE 0.9 % IV SOLN
2.0000 g | INTRAVENOUS | Status: AC
  Administered 2020-09-20 – 2020-09-24 (×5): 2 g via INTRAVENOUS
  Filled 2020-09-20 (×6): qty 20

## 2020-09-20 MED ORDER — HYDROCOD POLST-CPM POLST ER 10-8 MG/5ML PO SUER
5.0000 mL | Freq: Two times a day (BID) | ORAL | Status: DC | PRN
  Administered 2020-09-26: 5 mL via ORAL
  Filled 2020-09-20 (×2): qty 5

## 2020-09-20 MED ORDER — SODIUM CHLORIDE 0.9% FLUSH
3.0000 mL | Freq: Two times a day (BID) | INTRAVENOUS | Status: DC
  Administered 2020-09-20 – 2020-09-26 (×14): 3 mL via INTRAVENOUS

## 2020-09-20 MED ORDER — FLUOXETINE HCL 10 MG PO CAPS
10.0000 mg | ORAL_CAPSULE | Freq: Every morning | ORAL | Status: DC
  Administered 2020-09-20 – 2020-09-26 (×7): 10 mg via ORAL
  Filled 2020-09-20 (×8): qty 1

## 2020-09-20 MED ORDER — INSULIN ASPART 100 UNIT/ML IJ SOLN
0.0000 [IU] | Freq: Three times a day (TID) | INTRAMUSCULAR | Status: DC
  Administered 2020-09-20: 3 [IU] via SUBCUTANEOUS
  Administered 2020-09-21: 5 [IU] via SUBCUTANEOUS
  Administered 2020-09-21: 15 [IU] via SUBCUTANEOUS
  Administered 2020-09-21: 11 [IU] via SUBCUTANEOUS
  Administered 2020-09-22: 8 [IU] via SUBCUTANEOUS
  Administered 2020-09-22 (×2): 15 [IU] via SUBCUTANEOUS
  Administered 2020-09-23 (×2): 8 [IU] via SUBCUTANEOUS
  Administered 2020-09-23: 3 [IU] via SUBCUTANEOUS
  Administered 2020-09-24 (×2): 11 [IU] via SUBCUTANEOUS
  Administered 2020-09-24 – 2020-09-25 (×2): 8 [IU] via SUBCUTANEOUS
  Administered 2020-09-25: 11 [IU] via SUBCUTANEOUS
  Administered 2020-09-25 – 2020-09-26 (×2): 3 [IU] via SUBCUTANEOUS

## 2020-09-20 NOTE — ED Notes (Signed)
Admitting at bedside currently, pt will move as soon as attending consult is complete. Floor aware orders will be entered asap.

## 2020-09-20 NOTE — ED Provider Notes (Signed)
[facility] EMERGENCY DEPARTMENT Provider Note   CSN: [id number] Arrival date & time: 08/31/2020  9675     History Chief Complaint  Patient presents with  . Weakness  . Covid    Joel Holloway is a 86 y.o. male presenting for generalized weakness and cough.  Patient states he has had generalized weakness and a productive cough.  Diagnosed with COVID 8 days ago at urgent care.  Does not know if he was started on any medication.  Per EMS, patient was found in his recliner, appeared to have been there for an extended period of time.  Was 86% on room air, does not wear oxygen at baseline.  Patient denies history of lung problems.  Patient reports shortness of breath, worse with exertion.  He denies chest pain, nausea, vomiting, abdominal pain.  He has had some mild diarrhea.  Per chart review, patient with a history of A. fib, is on anticoagulation (unclear if warfarin or xarelto), diabetes, hypertension, CAD, polio, prostate cancer.  HPI     Past Medical History:  Diagnosis Date  . Atrial fibrillation ([place])   . Diabetes mellitus without complication ([place])   . Hypertension   . Myocardial infarction ([place])   . Polio   . Prostate cancer ([place])   . Stroke [facility])     There are no problems to display for this patient.   History reviewed. No pertinent surgical history.     History reviewed. No pertinent family history.  Social History   Tobacco Use  . Smoking status: Former Research scientist (life sciences)  . 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 Authorizing Provider  alfuzosin (UROXATRAL) 10 MG 24 hr tablet Take 10 mg by mouth every evening. 06/18/20   [provider]  dicyclomine (BENTYL) 20 MG tablet Take 20 mg by mouth daily. 06/17/20   [provider]  FLUoxetine (PROZAC) 10 MG capsule Take 10 mg by mouth daily. 06/17/20   [provider]  glipiZIDE (GLUCOTROL) 5  MG tablet Take 5 mg by mouth daily. 06/18/20   [provider]  HYDROcodone-acetaminophen (NORCO/VICODIN) 5-325 MG tablet Take 1 tablet by mouth every 6 (six) hours as needed for severe pain. 07/03/20   Dorie Rank, MD  levETIRAcetam (KEPPRA) 500 MG tablet Take 500 mg by mouth 2 (two) times daily. 06/18/20   [provider]  levothyroxine (SYNTHROID) 25 MCG tablet Take 25 mcg by mouth every morning. 06/18/20   [provider]  metoprolol succinate (TOPROL-XL) 100 MG 24 hr tablet Take 100 mg by mouth daily. 06/18/20   [provider]  Probiotic Product (PROBIOTIC PO) Take 1 capsule by mouth daily.    [provider]  simvastatin (ZOCOR) 10 MG tablet Take 10 mg by mouth daily. 06/18/20   [provider]  triamcinolone (KENALOG) 0.1 % Apply topically 2 (two) times daily. 06/09/20   [provider]  XARELTO 15 MG TABS tablet Take 15 mg by mouth daily. 05/05/20   [provider]    Allergies    Patient has no known allergies.  Review of Systems   Review of Systems  Constitutional: Positive for fever.  Respiratory: Positive for cough and shortness of breath.   Gastrointestinal: Positive for diarrhea.  Neurological: Positive for weakness.  All other systems reviewed and are negative.   Physical Exam Updated Vital Signs BP 90/61   Pulse (!) 26   Temp (!) 101.3  F (38.5 C) (Oral)   Resp (!) 31   SpO2 93%   Physical Exam Vitals and nursing note reviewed. Exam conducted with a chaperone present.  Constitutional:      General: He is not in acute distress.    Appearance: He is well-developed.  HENT:     Head: Normocephalic and atraumatic.  Eyes:     Extraocular Movements: Extraocular movements intact.     Conjunctiva/sclera: Conjunctivae normal.     Pupils: Pupils are equal, round, and reactive to light.  Cardiovascular:     Rate and Rhythm: Normal rate and regular rhythm.     Pulses: Normal pulses.  Pulmonary:      Effort: Pulmonary effort is normal. No respiratory distress.     Breath sounds: Wheezing and rhonchi present.     Comments: Crackles in bilateral lower bases. Expiratory wheezes heard. SpO2 91-93% on 3L via [place].  Abdominal:     General: There is no distension.     Palpations: Abdomen is soft. There is no mass.     Tenderness: There is no abdominal tenderness. There is no guarding or rebound.  Genitourinary:    Comments: Stool in depends. Skin of scrotum and perirectal area is erythematous and tender.  Musculoskeletal:        General: Normal range of motion.     Cervical back: Normal range of motion and neck supple.  Skin:    General: Skin is warm and dry.     Capillary Refill: Capillary refill takes less than 2 seconds.  Neurological:     Mental Status: He is alert and oriented to person, place, and time.     ED Results / Procedures / Treatments   Labs (all labs ordered are listed, but only abnormal results are displayed) Labs Reviewed  CBC WITH DIFFERENTIAL/PLATELET - Abnormal; Notable for the following components:      Result Value   WBC 10.7 (*)    Hemoglobin 12.1 (*)    HCT 38.3 (*)    RDW 16.7 (*)    Platelets 603 (*)    Neutro Abs 9.3 (*)    Lymphs Abs 0.5 (*)    All other components within normal limits  COMPREHENSIVE METABOLIC PANEL - Abnormal; Notable for the following components:   CO2 19 (*)    Glucose, Bld 158 (*)    BUN 47 (*)    Creatinine, Ser 2.20 (*)    Calcium 8.3 (*)    Albumin 2.8 (*)    GFR, Estimated 29 (*)    All other components within normal limits  D-DIMER, QUANTITATIVE - Abnormal; Notable for the following components:   D-Dimer, Quant 5.56 (*)    All other components within normal limits  LACTATE DEHYDROGENASE - Abnormal; Notable for the following components:   LDH 259 (*)    All other components within normal limits  FERRITIN - Abnormal; Notable for the following components:   Ferritin 763 (*)    All other components within normal limits   FIBRINOGEN - Abnormal; Notable for the following components:   Fibrinogen >800 (*)    All other components within normal limits  C-REACTIVE PROTEIN - Abnormal; Notable for the following components:   CRP 28.7 (*)    All other components within normal limits  PROTIME-INR - Abnormal; Notable for the following components:   Prothrombin Time 17.8 (*)    INR 1.5 (*)    All other components within normal limits  CBG MONITORING, ED - Abnormal; Notable for  the following components:   Glucose-Capillary 158 (*)    All other components within normal limits  CULTURE, BLOOD (ROUTINE X 2)  CULTURE, BLOOD (ROUTINE X 2)  LACTIC ACID, PLASMA  TRIGLYCERIDES  LACTIC ACID, PLASMA  PROCALCITONIN    EKG EKG Interpretation  Date/Time:  Monday Sep 20 2020 08:52:44 EDT Ventricular Rate:  103 PR Interval:  225 QRS Duration: 93 QT Interval:  345 QTC Calculation: 452 R Axis:   -14 Text Interpretation: Sinus or ectopic atrial tachycardia Multiple premature complexes, vent & supraven Prolonged PR interval Low voltage, precordial leads No previous ECGs available Confirmed by Gareth Morgan ([id number]) on 09/07/2020 9:33:26 AM   Radiology DG Chest Port 1 View  Result Date: 08/24/2020 CLINICAL DATA:  Weakness. Worsening symptoms of COVID-19 infection. Hypoxia. EXAM: PORTABLE CHEST 1 VIEW COMPARISON:  None. FINDINGS: Cardiac silhouette is partly obscured due to low lung volumes, but grossly normal in size. No mediastinal or hilar masses. Bilateral lung base opacities, more apparent on the left, consistent with atelectasis, accentuated by low lung volumes. Remainder of the lungs is clear. No convincing pleural effusion and no pneumothorax. Skeletal structures are demineralized but grossly intact. IMPRESSION: 1. Left greater than right lung base opacities most likely atelectasis with pneumonia also in the differential diagnosis. 2. No other evidence of acute cardiopulmonary disease. No pulmonary edema.  Electronically Signed   By: Lajean Manes M.D.   On: 09/01/2020 09:21    Procedures Procedures   Medications Ordered in ED Medications  acetaminophen (TYLENOL) tablet 650 mg (650 mg Oral Given 08/24/2020 0939)  albuterol (VENTOLIN HFA) 108 (90 Base) MCG/ACT inhaler 2 puff (2 puffs Inhalation Given 09/02/2020 1761)    ED Course  I have reviewed the triage vital signs and the nursing notes.  Pertinent labs & imaging results that were available during my care of the patient were reviewed by me and considered in my medical decision making (see chart for details).    MDM Rules/Calculators/A&P                          Patient presenting for evaluation of shortness of breath, cough, generalized weakness and diarrhea in the setting of positive COVID infection.  On exam, patient appears unkept, but not in acute distress.  Pulmonary exam concerning, patient has rhonchi in bilateral bases and wheezing noted with expiration.  He is hypoxic on room air, improved slightly with nasal cannula.  He is febrile at 101.3.  In the setting of hypoxia, worsening symptoms, and multiple risk factors including age and medical comorbidities, patient would likely benefit from hospitalization for his COVID.  Will order labs and chest x-ray.  Chest x-ray viewed and independently interpreted by me, shows bilateral opacities.  Labs show elevated inflammatory markers consistent with COVID. Will call for admission.   Discussed with Dr. Lorin Mercy and [facility] hospitalist service, patient to be admitted.  Final Clinical Impression(s) / ED Diagnoses Final diagnoses:  COVID-19  Hypoxia    Rx / DC Orders ED Discharge Orders    None       Franchot Heidelberg, PA-C 09/07/2020 1601    Gareth Morgan, MD 09/21/20 1442

## 2020-09-20 NOTE — H&P (Addendum)
History and Physical    Joel Holloway:[id number] DOB: [birth year] DOA: 09/16/2020  PCP: [name], DO Consultants:  None Patient coming from: [place] - lives alone; NOK: Daughter, [name], [name]  Chief Complaint: Worsening COVID symptoms  HPI: [name] is a 86 y.o. male with medical history significant of CVA; prostate CA; CAD; HTN; DM; and afib on [facility] presenting with worsening COVID symptoms.  He was seen in UC on 5/22 with 5 days of sore throat along with mild cough, congestion, rhinorrhea, fatigue.  He is vaccinated but tested + for COVID.  He was not treated and was told to f/u with his PCP.  Since then, he has been increasingly fatigued.  Some SOB and cough were noticeable along with mild GI symptoms, but the fatigue and anorexia were his most prominent symptoms.    ED Course:  COVID.  + 8 days ago, on 5/22.  Mostly worsening weakness, also cough and SOB.  86% on RA with EMS.  Currently on 3L, 91-93%.  CXR with opacities.  Does not appear to have been prescribed MAB therapy at [facility] when diagnosed.  Review of Systems: As per HPI; otherwise review of systems reviewed and negative.    COVID Vaccine Status:   Complete  Past Medical History:  Diagnosis Date  . Atrial fibrillation ([place])   . Diabetes mellitus without complication ([place])   . Hypertension   . Myocardial infarction ([place])   . Polio   . Prostate cancer ([place])   . Stroke [facility])     History reviewed. No pertinent surgical history.  Social History   Socioeconomic History  . Marital status: Single    Spouse name: Not on file  . Number of children: Not on file  . Years of education: Not on file  . Highest education level: Not on file  Occupational History  . Occupation: retired  Tobacco Use  . Smoking status: Former Smoker    Years: 10.00    Quit date: 87    Years since quitting: 60.4  . Smokeless tobacco: Former Network engineer and Sexual Activity  . Alcohol  use: Not Currently  . Drug use: Never  . Sexual activity: Not on file  Other Topics Concern  . Not on file  Social History Narrative  . Not on file   Social Determinants of Health   Financial Resource Strain: Not on file  Food Insecurity: Not on file  Transportation Needs: Not on file  Physical Activity: Not on file  Stress: Not on file  Social Connections: Not on file  Intimate Partner Violence: Not on file    No Known Allergies  History reviewed. No pertinent family history.  Prior to Admission medications   Medication Sig Start Date End Date Taking? Authorizing Provider  alfuzosin (UROXATRAL) 10 MG 24 hr tablet Take 10 mg by mouth every morning. 06/18/20  Yes [provider]  dicyclomine (BENTYL) 20 MG tablet Take 20 mg by mouth 2 (two) times daily. 06/17/20  Yes [provider]  glipiZIDE (GLUCOTROL) 5 MG tablet Take 5 mg by mouth every evening. 06/18/20  Yes [provider]  Rivaroxaban (XARELTO) 15 MG TABS tablet Take 15 mg by mouth every morning.   Yes [provider]  azithromycin (ZITHROMAX) 250 MG tablet Take 250-500 mg by mouth See admin instructions. Take 2 tablets (500 mg) by mouth 1st day, then take 1 tablet (250 mg) daily on days 2-5 09/14/20   [provider]  FLUoxetine (  PROZAC) 10 MG capsule Take 10 mg by mouth daily. 06/17/20   [provider]  HYDROcodone-acetaminophen (NORCO/VICODIN) 5-325 MG tablet Take 1 tablet by mouth every 6 (six) hours as needed for severe pain. Patient not taking: Reported on 08/31/2020 07/03/20   Dorie Rank, MD  levETIRAcetam (KEPPRA) 500 MG tablet Take 500 mg by mouth 2 (two) times daily. 06/18/20   [provider]  levothyroxine (SYNTHROID) 25 MCG tablet Take 25 mcg by mouth every morning. 06/18/20   [provider]  metoprolol succinate (TOPROL-XL) 100 MG 24 hr tablet Take 100 mg by mouth daily. 06/18/20   [provider]  simvastatin (ZOCOR) 10 MG tablet Take 10  mg by mouth daily. 06/18/20   [provider]  triamcinolone (KENALOG) 0.1 % Apply topically 2 (two) times daily. 06/09/20   [provider]    Physical Exam: Vitals:   08/22/2020 1000 09/14/2020 1015 09/16/2020 1030 08/24/2020 1100  BP:   90/61 98/62  Pulse: (!) 103 (!) 108 (!) 26 88  Resp: (!) 29 (!) 28 (!) 31 (!) 28  Temp:      TempSrc:      SpO2: (!) 89% 93% 93% 91%     . General:  Appears calm and comfortable and is in NAD but appears quite fatigued, O2 sats in low 90s on 4.5L [place] O2 . Eyes:  PERRL, EOMI, normal lids, iris . ENT:  grossly normal hearing, lips & tongue, mildly dry mm; appropriate dentition . Neck:  no LAD, masses or thyromegaly . Cardiovascular:  RRR, no m/r/g. No LE edema.  Marland Kitchen Respiratory:   Scattered rhonchi.  Mildly increased respiratory effort. . Abdomen:  soft, NT, ND . Skin:  no rash or induration seen on limited exam . Musculoskeletal:  grossly normal tone BUE/BLE, good ROM, no bony abnormality . Lower extremity:  No LE edema.  Limited foot exam with no ulcerations.  2+ distal pulses. Marland Kitchen Psychiatric:  blunted mood and affect, speech fluent and appropriate, AOx3 . Neurologic:  CN 2-12 grossly intact, moves all extremities in coordinated fashion    Radiological Exams on Admission: Independently reviewed - see discussion in A/P where applicable  DG Chest Port 1 View  Result Date: 09/21/2020 CLINICAL DATA:  Weakness. Worsening symptoms of COVID-19 infection. Hypoxia. EXAM: PORTABLE CHEST 1 VIEW COMPARISON:  None. FINDINGS: Cardiac silhouette is partly obscured due to low lung volumes, but grossly normal in size. No mediastinal or hilar masses. Bilateral lung base opacities, more apparent on the left, consistent with atelectasis, accentuated by low lung volumes. Remainder of the lungs is clear. No convincing pleural effusion and no pneumothorax. Skeletal structures are demineralized but grossly intact. IMPRESSION: 1. Left greater than right lung base  opacities most likely atelectasis with pneumonia also in the differential diagnosis. 2. No other evidence of acute cardiopulmonary disease. No pulmonary edema. Electronically Signed   By: Lajean Manes M.D.   On: 09/16/2020 09:21    EKG: Independently reviewed.  Sinus tachycardia with rate 103; no evidence of acute ischemia   Labs on Admission: I have personally reviewed the available labs and imaging studies at the time of the admission.  Pertinent labs:   CO2 19 BUN 47/Creatinine 2.20/GFR 29 - stable LDH 259 Ferritin 763 CRP 28.7 Lactate 1.6 WBC 10.7 Hgb 12.1 D-dimer 5.56 Fibrinogen >800 INR 1.5   Assessment/Plan Principal Problem:   COVID-19 Active Problems:   Stroke [facility])   Prostate cancer (HCC)   Hypertension   Diabetes mellitus without complication (  [place])   Atrial fibrillation ([place])   Acute respiratory failure with hypoxia due to COVID-19 PNA -Patient without baseline O2 requirement with 5/22 + COVID test presenting with SOB, fatigue, anorexia -He does not have a usual home O2 requirement and is currently requiring 4-5L [place] O2 -COVID POSITIVE -The patient has comorbidities which may increase the risk for ARDS/MODS including: age, HTN, DM, obesity, CAD -Pertinent labs concerning for COVID include increased LDH; markedly elevated D-dimer (>1); increased ferritin; markedly elevated CRP (>7); markedly increased fibrinogen -CXR with multifocal opacities which may be c/w COVID vs. Multifocal PNA -Will treat with broad-spectrum antibiotics given procalcitonin >0.5.   -Will admit for further evaluation, close monitoring, and treatment -Monitor on telemetry x at least 24 hours -At this time, will attempt to avoid use of aerosolized medications and use HFAs instead -Will check daily labs including BMP with Mag, Phos; LFTs; CBC with differential; CRP; ferritin; fibrinogen; D-dimer -Will order steroids and Remdesivir (pharmacy consult) given +COVID test, +CXR, and hypoxia <94% on  room air -If the patient shows clinical deterioration, consider transfer to ICU with PCCM consultation -Consider IL-6 agonist (Actemra) and/or JAK inhibitor (baricitinib) if the patient does not stabilize on current treatment or if the patient has marked clinical decompensation; the patient does not appear to require this treatment at this time but has been consented and agrees to receive treatment if there is evidence of clinical worsening despite current treatments. -Will attempt to maintain euvolemia to a net negative fluid status -Will ask the patient to maintain an awake prone position as much as possible -PT/OT consults -Encourage mobilization/ambulation as much as possible -Patient was seen wearing full PPE including: gown, gloves, head cover, N95, and face shield; donning and doffing was in compliance with current standards.  DM -Will check A1c -May utilize continuous glucose monitoring -hold Glucotrol -Cover with moderate-scale SSI  HTN -Continue Toprol XL  HLD -Continue Zocor  Afib -Rate control with Toprol -Continue Xarelto - although with his renal function at some point it may be reasonable to transition to Eliquis  Stage 3b/4 CKD -Appears to be similar to prior ER visit on 3/12 -Needs ongoing monitoring  Obesity -BMI is 32.5 -Weight loss should be encouraged -Outpatient PCP/bariatric medicine f/u encouraged    *His daughter is concerned that he might need SNF after hospitalization; he tends to decompensate quickly and tends to be slow to rebound.*    Level of care: Telemetry Medical DVT prophylaxis:  Lovenox  Code Status:  DNR - confirmed with patient Family Communication: None present; I spoke with the patient's daughter by telephone. Disposition Plan:  The patient is from: home  Anticipated d/c is to: home without [facility] services once his respiratory issues have been resolved.  He may require home O2 at the time of discharge.  Anticipated d/c date will depend  on clinical response to treatment, likely between 3 days (with completion of outpatient Remdesivir treatment) and 5 days  Patient is currently: acutely ill Consults called: PT/OT; TOC team consult  Admission status: Admit - It is my clinical opinion that admission to INPATIENT is reasonable and necessary because of the expectation that this patient will require hospital care that crosses at least 2 midnights to treat this condition based on the medical complexity of the problems presented.  Given the aforementioned information, the predictability of an adverse outcome is felt to be significant.     Karmen Bongo MD [facility]   How to contact the [facility] Attending or Consulting provider  7A - 7P or covering provider during after hours [place], for this patient?  1. Check the care team in [facility] and look for a) attending/consulting TRH provider listed and b) the [facility] team listed 2. Log into [email or web address] and use [place]'s universal password to access. If you do not have the password, please contact the hospital operator. 3. Locate the [facility] provider you are looking for under [facility] and page to a number that you can be directly reached. 4. If you still have difficulty reaching the provider, please page the [facility] (Director on Call) for the Hospitalists listed on amion for assistance.   09/08/2020, 12:43 PM

## 2020-09-20 NOTE — ED Triage Notes (Signed)
Pt BIB [facility] from [place] d/t weakness & worsening Covid symptoms, he was Dx on Thursday (09/16/20). EMS reports pt had been sitting in his recliner for an unknown amt of time with a pungent odor & was 86% on RA. Upon arrival to ED pt was A/Ox4, verbal-able to make needs known, non-productive cough with expiratory wheezing & 101.3 oral Temp.

## 2020-09-21 LAB — CBC WITH DIFFERENTIAL/PLATELET
Abs Immature Granulocytes: 0 10*3/uL (ref 0.00–0.07)
Basophils Absolute: 0 10*3/uL (ref 0.0–0.1)
Basophils Relative: 0 %
Eosinophils Absolute: 0.3 10*3/uL (ref 0.0–0.5)
Eosinophils Relative: 4 %
HCT: 36.3 % — ABNORMAL LOW (ref 39.0–52.0)
Hemoglobin: 11.5 g/dL — ABNORMAL LOW (ref 13.0–17.0)
Lymphocytes Relative: 2 %
Lymphs Abs: 0.2 10*3/uL — ABNORMAL LOW (ref 0.7–4.0)
MCH: 27.4 pg (ref 26.0–34.0)
MCHC: 31.7 g/dL (ref 30.0–36.0)
MCV: 86.4 fL (ref 80.0–100.0)
Monocytes Absolute: 0.2 10*3/uL (ref 0.1–1.0)
Monocytes Relative: 3 %
Neutro Abs: 6.8 10*3/uL (ref 1.7–7.7)
Neutrophils Relative %: 91 %
Platelets: 570 10*3/uL — ABNORMAL HIGH (ref 150–400)
RBC: 4.2 MIL/uL — ABNORMAL LOW (ref 4.22–5.81)
RDW: 16.5 % — ABNORMAL HIGH (ref 11.5–15.5)
WBC: 7.5 10*3/uL (ref 4.0–10.5)
nRBC: 0 % (ref 0.0–0.2)
nRBC: 0 /100 WBC

## 2020-09-21 LAB — COMPREHENSIVE METABOLIC PANEL
ALT: 30 U/L (ref 0–44)
AST: 42 U/L — ABNORMAL HIGH (ref 15–41)
Albumin: 2.6 g/dL — ABNORMAL LOW (ref 3.5–5.0)
Alkaline Phosphatase: 64 U/L (ref 38–126)
Anion gap: 14 (ref 5–15)
BUN: 53 mg/dL — ABNORMAL HIGH (ref 8–23)
CO2: 21 mmol/L — ABNORMAL LOW (ref 22–32)
Calcium: 8.1 mg/dL — ABNORMAL LOW (ref 8.9–10.3)
Chloride: 101 mmol/L (ref 98–111)
Creatinine, Ser: 2.28 mg/dL — ABNORMAL HIGH (ref 0.61–1.24)
GFR, Estimated: 28 mL/min — ABNORMAL LOW (ref >60)
Glucose, Bld: 277 mg/dL — ABNORMAL HIGH (ref 70–99)
Potassium: 4 mmol/L (ref 3.5–5.1)
Sodium: 136 mmol/L (ref 135–145)
Total Bilirubin: 0.6 mg/dL (ref 0.3–1.2)
Total Protein: 6.5 g/dL (ref 6.5–8.1)

## 2020-09-21 LAB — GLUCOSE, CAPILLARY
Glucose-Capillary: 239 mg/dL — ABNORMAL HIGH (ref 70–99)
Glucose-Capillary: 352 mg/dL — ABNORMAL HIGH (ref 70–99)
Glucose-Capillary: 325 mg/dL — ABNORMAL HIGH (ref 70–99)
Glucose-Capillary: 208 mg/dL — ABNORMAL HIGH (ref 70–99)

## 2020-09-21 LAB — PHOSPHORUS: Phosphorus: 4.2 mg/dL (ref 2.5–4.6)

## 2020-09-21 LAB — MAGNESIUM: Magnesium: 2.2 mg/dL (ref 1.7–2.4)

## 2020-09-21 LAB — D-DIMER, QUANTITATIVE: D-Dimer, Quant: 4.13 ug/mL-FEU — ABNORMAL HIGH (ref 0.00–0.50)

## 2020-09-21 LAB — FERRITIN: Ferritin: 898 ng/mL — ABNORMAL HIGH (ref 24–336)

## 2020-09-21 LAB — C-REACTIVE PROTEIN: CRP: 28.6 mg/dL — ABNORMAL HIGH (ref <1.0)

## 2020-09-21 LAB — HEMOGLOBIN A1C
Hgb A1c MFr Bld: 5.7 % — ABNORMAL HIGH (ref 4.8–5.6)
Mean Plasma Glucose: 117 mg/dL

## 2020-09-21 NOTE — Evaluation (Signed)
Occupational Therapy Evaluation Patient Details Name: Joel Holloway MRN: [id number] DOB: 1934-10-26 Today's Date: 09/21/2020    History of Present Illness Pt is an 86 yo male s/p COVID.  + 8 days ago, on 5/22.  Mostly worsening weakness, also cough and SOB.  86% on RA with EMS.  Currently on 3L, 91-93%. PMHx: CVA; prostate CA; CAD; HTN; DM; and afib on [facility]   Clinical Impression   Pt PTA: Pt living in ILF (alone) and reports independence; walking to dining hall for meals x2 times daily. Pt limited by decreased strength, decreased ability to care for self, decreased activity tolerance and decreased functional endurance. Pt supervisionA to [place] for OOB ADL; transfers and mobility with minguardA to [place].  Pt fatigues easily and appears slightly confused about situation. Pt's O2 4L >84-90% with exertion with mobility with varying levels of O2 pleth reading. Pt with no physical assist daily from family and would need to be at higher level of care before returning home. Pt coughing incessantly-pt invited to hold blanket to bell when  Pt would benefit from continued OT skilled services. OT following acutely.    Follow Up Recommendations  SNF    Equipment Recommendations  3 in 1 bedside commode    Recommendations for Other Services       Precautions / Restrictions Precautions Precautions: Fall;Other (comment) Precaution Comments: O2 chronic Restrictions Weight Bearing Restrictions: No      Mobility Bed Mobility Overal bed mobility: Needs Assistance Bed Mobility: Supine to Sit;Sit to Supine     Supine to sit: Supervision;HOB elevated Sit to supine: Supervision;HOB elevated   General bed mobility comments: Increased time and use of rail with HOB elevated    Transfers Overall transfer level: Needs assistance Equipment used: Rolling walker (2 wheeled) Transfers: Sit to/from [facility] Sit to Stand: Min guard Stand pivot transfers: Min guard       General  transfer comment: Use of RW for stability    Balance Overall balance assessment: Needs assistance Sitting-balance support: Bilateral upper extremity supported;Feet supported Sitting balance-Leahy Scale: Fair     Standing balance support: Single extremity supported;During functional activity Standing balance-Leahy Scale: Poor Standing balance comment: use of external supports                           ADL either performed or assessed with clinical judgement   ADL Overall ADL's : Needs assistance/impaired Eating/Feeding: Set up;Sitting   Grooming: Set up;Sitting Grooming Details (indicate cue type and reason): Pt standing briefly before deciding to walk back to bed Upper Body Bathing: Set up;Sitting   Lower Body Bathing: Moderate assistance;Cueing for safety;Sitting/lateral leans;Sit to/from stand   Upper Body Dressing : Set up;Sitting   Lower Body Dressing: Moderate assistance;Cueing for safety;Sitting/lateral leans;Sit to/from stand   Toilet Transfer: Min guard;RW;Ambulation;Regular Toilet;Grab bars Armed forces technical officer Details (indicate cue type and reason): use of grab bars; ambulating to commode with RW: stood for toilet hygiene x1 min and required minguardA to ensure that pt was clean. Toileting- Clothing Manipulation and Hygiene: Minimal assistance       Functional mobility during ADLs: Min guard;Rolling walker General ADL Comments: Pt limited by decreased strength, decreased ability to care for self, decreased activity tolerance and decreased functional endurance. O2 4L >84-90% with exertion with mobility with varying levels of O2 pleth reading.     Vision Baseline Vision/History: No visual deficits Patient Visual Report: No change from baseline Vision Assessment?: No apparent visual deficits  Perception     Praxis      Pertinent Vitals/Pain Pain Assessment: 0-10 Pain Score: 5  Pain Location: ribs from coughing Pain Descriptors / Indicators:  Discomfort;Sore Pain Intervention(s): Monitored during session;Repositioned     Hand Dominance Right   Extremity/Trunk Assessment Upper Extremity Assessment Upper Extremity Assessment: Overall WFL for tasks assessed   Lower Extremity Assessment Lower Extremity Assessment: Generalized weakness   Cervical / Trunk Assessment Cervical / Trunk Assessment: Normal   Communication Communication Communication: Expressive difficulties;Other (comment) (soft spoken, breathy voice)   Cognition Arousal/Alertness: Awake/alert Behavior During Therapy: WFL for tasks assessed/performed Overall Cognitive Status: Within Functional Limits for tasks assessed                                 General Comments: A/O x3; unaware of situation.   General Comments  O2 4L >84-90% wtih exertion questionable pleth at times, but pt quickly recovered <1 min with pursed lip breathing.    Exercises     Shoulder Instructions      Home Living Family/patient expects to be discharged to:: Private residence Living Arrangements: Alone Available Help at Discharge: Family;Available PRN/intermittently Type of Home: Independent living facility Home Access: Level entry;Elevator     Home Layout: One level     Bathroom Shower/Tub: Occupational psychologist: Handicapped height     Home Equipment: Environmental consultant - 4 wheels;Shower seat;Bedside commode;Hand held shower head;Grab bars - tub/shower;Grab bars - toilet          Prior Functioning/Environment Level of Independence: Independent with assistive device(s)        Comments: Lives alone in ILF        OT Problem List: Decreased strength;Decreased activity tolerance;Impaired balance (sitting and/or standing);Decreased safety awareness;Decreased cognition;Cardiopulmonary status limiting activity;Obesity;Increased edema      OT Treatment/Interventions: Self-care/ADL training;Therapeutic exercise;Energy conservation;DME and/or AE  instruction;Therapeutic activities;Patient/family education;Balance training    OT Goals(Current goals can be found in the care plan section) Acute Rehab OT Goals Patient Stated Goal: to get better OT Goal Formulation: With patient Time For Goal Achievement: 10/05/20 Potential to Achieve Goals: Good ADL Goals Pt Will Perform Grooming: with modified independence;standing Pt Will Perform Lower Body Dressing: with supervision;sit to/from stand Pt Will Transfer to Toilet: with modified independence;ambulating;regular height toilet Additional ADL Goal #1: Pt will recall 5 items immediately and after 5 mins with minimal cues in 2/3 trials. Additional ADL Goal #2: Pt will perform x5 mins of OOB ADL with minimal rest breaks and O2 sats >90% with exertion.  OT Frequency: Min 2X/week   Barriers to D/C:            Co-evaluation              AM-PAC OT "6 Clicks" Daily Activity     Outcome Measure Help from another person eating meals?: None Help from another person taking care of personal grooming?: A Little Help from another person toileting, which includes using toliet, bedpan, or urinal?: A Little Help from another person bathing (including washing, rinsing, drying)?: A Little Help from another person to put on and taking off regular upper body clothing?: A Little Help from another person to put on and taking off regular lower body clothing?: A Little 6 Click Score: 19   End of Session Equipment Utilized During Treatment: Gait belt;Rolling walker;Oxygen Nurse Communication: Mobility status  Activity Tolerance: Patient limited by fatigue Patient left: in bed;with call bell/phone  within reach;with bed alarm set;Other (comment) (no recliner in room- no extra on unit at time of eval. pt placed in chair position with bed)  OT Visit Diagnosis: Unsteadiness on feet (R26.81);Muscle weakness (generalized) (M62.81)                Time: 4975-3005 OT Time Calculation (min): 39 min Charges:   OT General Charges $OT Visit: 1 Visit OT Evaluation $OT Eval Moderate Complexity: 1 Mod OT Treatments $Self Care/Home Management : 8-22 mins $Therapeutic Activity: 8-22 mins  Jefferey Pica, OTR/L Acute Rehabilitation Services Pager: [phone] Office: [phone]   Robet Crutchfield C 09/21/2020, 2:31 PM

## 2020-09-21 NOTE — Evaluation (Signed)
Physical Therapy Evaluation Patient Details Name: Joel Holloway MRN: [id number] DOB: May 25, 1934 Today's Date: 09/21/2020   History of Present Illness  Pt is an 86 yo male s/p COVID.  + 8 days ago, on 5/22.  Mostly worsening weakness, also cough and SOB.  86% on RA with EMS.  Currently on 3L, 91-93%. PMHx: CVA; prostate CA; CAD; HTN; DM; and afib on AC  Clinical Impression   Patient received in bed, pleasant and cooperative but quite confused. Tells me that he is in [place] in "ho" hospital, and that he is here to please his children and because he has monkeypox. Tends to be quite unsteady when on his feet, and required heavy MinA to maintain balance and safety with RW even when walking short distances in the room. SPO2 pleth was rather unreliable, and he had heavy bouts of coughing however did seem to require as much as 8LPM O2 for activity with pleth 80-88% with activity. Did have a couple of bursts of A-fib as high as 142BPM after activity. Left in bed with all needs met, RN aware of patient status and VSS on 6LPM (resting). Would benefit from skilled PT services in SNF setting at DC.   Follow Up Recommendations SNF;Supervision for mobility/OOB    Equipment Recommendations  Rolling walker with 5" wheels;3in1 (PT);Wheelchair (measurements PT);Wheelchair cushion (measurements PT)    Recommendations for Other Services       Precautions / Restrictions Precautions Precautions: Fall;Other (comment) Precaution Comments: O2 chronic Restrictions Weight Bearing Restrictions: No      Mobility  Bed Mobility Overal bed mobility: Needs Assistance Bed Mobility: Supine to Sit;Sit to Supine     Supine to sit: Min assist;HOB elevated Sit to supine: Supervision;HOB elevated   General bed mobility comments: Increased time and use of rail with HOB elevated, MinA when getting to EOB to boost trunk to midline sitting    Transfers Overall transfer level: Needs assistance Equipment used:  Rolling walker (2 wheeled) Transfers: Sit to/from [facility] Sit to Stand: Min assist Stand pivot transfers: Min guard       General transfer comment: MinA to boost to upright standing and extra time/ongoing MinA to gain balance even with wide BOS  Ambulation/Gait Ambulation/Gait assistance: Min assist Gait Distance (Feet): 12 Feet Assistive device: Rolling walker (2 wheeled) Gait Pattern/deviations: Step-through pattern;Decreased step length - right;Decreased step length - left;Wide base of support;Trunk flexed;Drifts right/left Gait velocity: decreased   General Gait Details: generally unsteady requiring as much as heavy MinA with RW especially as fatigue increased. SPO2 pleth not of great quality, but he did require 8LPM to maintain sats even in the low 80s while walking- dropped to as much as 76% on 6LPM with activity.  Stairs            Wheelchair Mobility    Modified Rankin (Stroke Patients Only)       Balance Overall balance assessment: Needs assistance Sitting-balance support: Bilateral upper extremity supported;Feet supported Sitting balance-Leahy Scale: Fair     Standing balance support: Single extremity supported;During functional activity Standing balance-Leahy Scale: Poor Standing balance comment: use of external supports                             Pertinent Vitals/Pain Pain Assessment: Faces Pain Score: 5  Faces Pain Scale: No hurt Pain Location: ribs from coughing Pain Descriptors / Indicators: Discomfort;Sore Pain Intervention(s): Limited activity within patient's tolerance;Monitored during session    Home  Living Family/patient expects to be discharged to:: Private residence Living Arrangements: Alone Available Help at Discharge: Family;Available PRN/intermittently Type of Home: Independent living facility Home Access: Level entry;Elevator     Home Layout: One level Home Equipment: Walker - 4 wheels;Shower  seat;Bedside commode;Hand held shower head;Grab bars - tub/shower;Grab bars - toilet      Prior Function Level of Independence: Independent with assistive device(s)         Comments: Lives alone in ILF     Hand Dominance   Dominant Hand: Right    Extremity/Trunk Assessment   Upper Extremity Assessment Upper Extremity Assessment: Defer to OT evaluation    Lower Extremity Assessment Lower Extremity Assessment: Generalized weakness    Cervical / Trunk Assessment Cervical / Trunk Assessment: Normal  Communication   Communication: Expressive difficulties;Other (comment) (soft spoken, breathy voice)  Cognition Arousal/Alertness: Awake/alert Behavior During Therapy: WFL for tasks assessed/performed Overall Cognitive Status: Impaired/Different from baseline Area of Impairment: Orientation;Attention;Memory;Following commands;Safety/judgement;Awareness;Problem solving                 Orientation Level: Disoriented to;Situation;Place Current Attention Level: Sustained Memory: Decreased short-term memory Following Commands: Follows one step commands consistently;Follows one step commands with increased time Safety/Judgement: Decreased awareness of safety;Decreased awareness of deficits Awareness: Intellectual Problem Solving: Slow processing;Decreased initiation;Difficulty sequencing;Requires verbal cues General Comments: tells me he is at a hospital in [place] because he wanted to make his children happy, when asked why he is here in the hospital he tells me that he is here because of his monkeypox.      General Comments General comments (skin integrity, edema, etc.): O2 4L >84-90% wtih exertion questionable pleth at times, but pt quickly recovered <1 min with pursed lip breathing.    Exercises     Assessment/Plan    PT Assessment Patient needs continued PT services  PT Problem List Decreased strength;Decreased cognition;Decreased knowledge of use of DME;Decreased  activity tolerance;Decreased safety awareness;Decreased balance;Decreased mobility;Decreased coordination       PT Treatment Interventions DME instruction;Balance training;Gait training;Stair training;Cognitive remediation;Functional mobility training;Patient/family education;Therapeutic exercise;Therapeutic activities    PT Goals (Current goals can be found in the Care Plan section)  Acute Rehab PT Goals Patient Stated Goal: to get better PT Goal Formulation: With patient Time For Goal Achievement: 10/05/20 Potential to Achieve Goals: Fair    Frequency Min 3X/week   Barriers to discharge        Co-evaluation               AM-PAC PT "6 Clicks" Mobility  Outcome Measure Help needed turning from your back to your side while in a flat bed without using bedrails?: A Little Help needed moving from lying on your back to sitting on the side of a flat bed without using bedrails?: A Little Help needed moving to and from a bed to a chair (including a wheelchair)?: A Little Help needed standing up from a chair using your arms (e.g., wheelchair or bedside chair)?: A Little Help needed to walk in hospital room?: A Little Help needed climbing 3-5 steps with a railing? : A Lot 6 Click Score: 17    End of Session   Activity Tolerance: Patient tolerated treatment well Patient left: in bed;with call bell/phone within reach;with bed alarm set;Other (comment) (bed in chair position) Nurse Communication: Mobility status PT Visit Diagnosis: Unsteadiness on feet (R26.81);Difficulty in walking, not elsewhere classified (R26.2);Muscle weakness (generalized) (M62.81)    Time: 2353-6144 PT Time Calculation (min) (ACUTE ONLY): 33 min  Charges:   PT Evaluation $PT Eval Moderate Complexity: 1 Mod PT Treatments $Gait Training: 8-22 mins        Windell Norfolk, DPT, PN1   Supplemental Physical Therapist [place]    Pager [phone] Acute Rehab Office [phone]

## 2020-09-21 NOTE — Progress Notes (Signed)
PROGRESS NOTE    Joel Holloway  CWU:[id number] DOB: 1934/07/31 DOA: 08/26/2020 PCP: [name], DO   Brief Narrative:  Joel Holloway is a 86 y.o. male with medical history significant of CVA; prostate CA; CAD; HTN; DM; and afib on AC presenting with worsening COVID symptoms.   Patient tested positive for COVID approximately 8 days ago with worsening symptoms of respiratory distress with exertion, dyspnea at rest, sore throat, rhinorrhea, cough congestion and generalized fatigue.  He also notes extremely poor p.o. intake over the past 5 to 6 days.  In the ED patient was noted to be 86% on room air at rest requiring upwards of 3 to 4 L nasal cannula to maintain sats above 90%.  It does not appear he had any outpatient treatment for COVID-19 pneumonia prior to admission.  Assessment & Plan:   Principal Problem:   COVID-19 Active Problems:   Stroke [facility])   Prostate cancer ([place])   Hypertension   Diabetes mellitus without complication ([place])   Atrial fibrillation ([place])   Acute respiratory failure with hypoxia due to COVID-19 PNA, POA Cannot rule out concurrent CAP, POA SpO2: 96 % O2 Flow Rate (L/min): 4 L/min - CXR personally reviewed, bibasilar atelectasis versus infiltrate consistent with community-acquired pneumonia > COVID - Continue azith/ceftriaixone for potential underlying CAP given procalcitonin >0.5.   - Continue steroids and Remdesivir per protocol - Patient consented to Actemra/similar if clinically deteriorates - Prone, IS/flutter, early ambulation as tolerated - PT/OT consults  DM, well-controlled non-insulin-dependent Lab Results  Component Value Date   HGBA1C 5.7 (H) 08/28/2020  - Continue sliding scale insulin, hypoglycemic protocol - Hold Glucotrol  HTN - Continue Toprol XL  HLD - Continue Zocor  Afib - Rate control with Toprol - Continue Xarelto -discussed possibility of transition to Eliquis in the near future given CKD 3B  Stage 3b  CKD -Appears to be similar to prior ER visit on 3/12 -Needs ongoing monitoring  Obesity -BMI is 32.5 -Weight loss should be encouraged -Outpatient PCP/bariatric medicine f/u encouraged   DVT prophylaxis:  Lovenox  Code Status:  DNR - confirmed with patient Family Communication: Lengthy discussion with daughter over the phone  Status is: Inpatient  Dispo: The patient is from: Home (PT/HH/Caretaker)              Anticipated d/c is to: Home (resume HH/PT caretaker) vs SNF              Anticipated d/c date is: >72h              Patient currently not medically stable for discharge  Consultants:   None  Procedures:   None  Antimicrobials:  Azithromycin, ceftriaxone x5 days, tentative stop date 09/24/2020  Subjective: No acute issues or events overnight denies nausea vomiting diarrhea constipation headache fevers or chills.  Respiratory status appears to be stabilizing if not minimally improving.  Objective: Vitals:   09/10/2020 2123 09/21/20 0103 09/21/20 0425 09/21/20 1434  BP:  109/67 123/68 113/75  Pulse:  76 79 89  Resp:  (!) 22 19   Temp:  98.1 F (36.7 C) 98 F (36.7 C) 97.8 F (36.6 C)  TempSrc:  Axillary Axillary Oral  SpO2: 98% 97% 96%   Weight:      Height:        Intake/Output Summary (Last 24 hours) at 09/21/2020 1449 Last data filed at 09/21/2020 0241 Gross per 24 hour  Intake 1000.6 ml  Output --  Net 1000.6 ml   Joel Holloway  Weights   08/30/2020 1500  Weight: 97.1 kg    Examination:  General exam: Appears calm and comfortable  Respiratory system: Clear to auscultation. Respiratory effort normal. Cardiovascular system: S1 & S2 heard, RRR. No JVD, murmurs, rubs, gallops or clicks. No pedal edema. Gastrointestinal system: Abdomen is nondistended, soft and nontender. No organomegaly or masses felt. Normal bowel sounds heard. Central nervous system: Alert and oriented. No focal neurological deficits. Extremities: Symmetric 5 x 5 power. Skin: No rashes,  lesions or ulcers Psychiatry: Judgement and insight appear normal. Mood & affect appropriate.     Data Reviewed: I have personally reviewed following labs and imaging studies  CBC: Recent Labs  Lab 08/23/2020 0909 09/21/20 0357  WBC 10.7* 7.5  NEUTROABS 9.3* 6.8  HGB 12.1* 11.5*  HCT 38.3* 36.3*  MCV 86.3 86.4  PLT 603* 696*   Basic Metabolic Panel: Recent Labs  Lab 08/30/2020 0909 09/21/20 0357  NA 135 136  K 3.8 4.0  CL 103 101  CO2 19* 21*  GLUCOSE 158* 277*  BUN 47* 53*  CREATININE 2.20* 2.28*  CALCIUM 8.3* 8.1*  MG  --  2.2  PHOS  --  4.2   GFR: Estimated Creatinine Clearance: 27.7 mL/min (A) (by C-G formula based on SCr of 2.28 mg/dL (H)). Liver Function Tests: Recent Labs  Lab 08/24/2020 0909 09/21/20 0357  AST 35 42*  ALT 23 30  ALKPHOS 65 64  BILITOT 1.2 0.6  PROT 6.9 6.5  ALBUMIN 2.8* 2.6*   No results for input(s): LIPASE, AMYLASE in the last 168 hours. No results for input(s): AMMONIA in the last 168 hours. Coagulation Profile: Recent Labs  Lab 09/16/2020 0909  INR 1.5*   Cardiac Enzymes: No results for input(s): CKTOTAL, CKMB, CKMBINDEX, TROPONINI in the last 168 hours. BNP (last 3 results) No results for input(s): PROBNP in the last 8760 hours. HbA1C: Recent Labs    08/27/2020 0909  HGBA1C 5.7*   CBG: Recent Labs  Lab 09/08/2020 1308 09/17/2020 1640 09/17/2020 2125 09/21/20 0736 09/21/20 1141  GLUCAP 162* 204* 183* 239* 352*   Lipid Profile: Recent Labs    08/30/2020 0909  TRIG 79   Thyroid Function Tests: No results for input(s): TSH, T4TOTAL, FREET4, T3FREE, THYROIDAB in the last 72 hours. Anemia Panel: Recent Labs    09/03/2020 0909 09/21/20 0357  FERRITIN 763* 898*   Sepsis Labs: Recent Labs  Lab 09/19/2020 0909 09/03/2020 1138  PROCALCITON 0.72  --   LATICACIDVEN 1.6 1.5    Recent Results (from the past 240 hour(s))  Blood Culture (routine x 2)     Status: None (Preliminary result)   Collection Time: 09/13/2020  9:05 AM    Specimen: BLOOD  Result Value Ref Range Status   Specimen Description BLOOD BLOOD LEFT WRIST  Final   Special Requests   Final    BOTTLES DRAWN AEROBIC AND ANAEROBIC Blood Culture results may not be optimal due to an excessive volume of blood received in culture bottles   Culture   Final    NO GROWTH < 24 HOURS Performed at [facility], [place] [address], [place], [place] [address]    Report Status PENDING  Incomplete  Blood Culture (routine x 2)     Status: None (Preliminary result)   Collection Time: 08/27/2020  9:10 AM   Specimen: BLOOD  Result Value Ref Range Status   Specimen Description BLOOD BLOOD RIGHT HAND  Final   Special Requests   Final    BOTTLES  DRAWN AEROBIC AND ANAEROBIC Blood Culture adequate volume   Culture   Final    NO GROWTH < 24 HOURS Performed at [facility], [place] [address], [place], [place] [address]    Report Status PENDING  Incomplete         Radiology Studies: DG Chest Port 1 View  Result Date: 08/29/2020 CLINICAL DATA:  Weakness. Worsening symptoms of COVID-19 infection. Hypoxia. EXAM: PORTABLE CHEST 1 VIEW COMPARISON:  None. FINDINGS: Cardiac silhouette is partly obscured due to low lung volumes, but grossly normal in size. No mediastinal or hilar masses. Bilateral lung base opacities, more apparent on the left, consistent with atelectasis, accentuated by low lung volumes. Remainder of the lungs is clear. No convincing pleural effusion and no pneumothorax. Skeletal structures are demineralized but grossly intact. IMPRESSION: 1. Left greater than right lung base opacities most likely atelectasis with pneumonia also in the differential diagnosis. 2. No other evidence of acute cardiopulmonary disease. No pulmonary edema. Electronically Signed   By: Lajean Manes M.D.   On: 08/26/2020 09:21        Scheduled Meds: . alfuzosin  10 mg Oral q morning  . vitamin C  500 mg Oral Daily  . dicyclomine  20 mg Oral BID  . docusate sodium  100 mg  Oral BID  . FLUoxetine  10 mg Oral q morning  . insulin aspart  0-15 Units Subcutaneous TID WC  . insulin aspart  0-5 Units Subcutaneous QHS  . levETIRAcetam  500 mg Oral q morning  . levothyroxine  25 mcg Oral Q0600  . methylPREDNISolone (SOLU-MEDROL) injection  50 mg Intravenous BID   Followed by  . [START ON 09/23/2020] predniSONE  50 mg Oral Daily  . metoprolol succinate  100 mg Oral q morning  . Rivaroxaban  15 mg Oral q morning  . simvastatin  10 mg Oral QPM  . sodium chloride flush  3 mL Intravenous Q12H  . sodium chloride flush  3 mL Intravenous Q12H  . zinc sulfate  220 mg Oral Daily   Continuous Infusions: . sodium chloride    . azithromycin 500 mg (09/21/20 1154)  . cefTRIAXone (ROCEPHIN)  IV 2 g (09/21/20 1151)  . remdesivir 100 mg in NS 100 mL 100 mg (09/21/20 0906)     LOS: 1 day   Time spent: 71min  Joel Paulding C Deshannon Seide, DO [facility]  If 7PM-7AM, please contact night-coverage [email or web address]  09/21/2020, 2:49 PM

## 2020-09-22 DIAGNOSIS — I4891 Unspecified atrial fibrillation: Secondary | ICD-10-CM

## 2020-09-22 DIAGNOSIS — E119 Type 2 diabetes mellitus without complications: Secondary | ICD-10-CM

## 2020-09-22 LAB — CBC WITH DIFFERENTIAL/PLATELET
Abs Immature Granulocytes: 0 10*3/uL (ref 0.00–0.07)
Basophils Absolute: 0 10*3/uL (ref 0.0–0.1)
Basophils Relative: 0 %
Eosinophils Absolute: 0 10*3/uL (ref 0.0–0.5)
Eosinophils Relative: 0 %
HCT: 37 % — ABNORMAL LOW (ref 39.0–52.0)
Hemoglobin: 11.6 g/dL — ABNORMAL LOW (ref 13.0–17.0)
Lymphocytes Relative: 2 %
Lymphs Abs: 0.2 10*3/uL — ABNORMAL LOW (ref 0.7–4.0)
MCH: 27.2 pg (ref 26.0–34.0)
MCHC: 31.4 g/dL (ref 30.0–36.0)
MCV: 86.7 fL (ref 80.0–100.0)
Monocytes Absolute: 0.3 10*3/uL (ref 0.1–1.0)
Monocytes Relative: 3 %
Neutro Abs: 10 10*3/uL — ABNORMAL HIGH (ref 1.7–7.7)
Neutrophils Relative %: 95 %
Platelets: 641 10*3/uL — ABNORMAL HIGH (ref 150–400)
RBC: 4.27 MIL/uL (ref 4.22–5.81)
RDW: 16.4 % — ABNORMAL HIGH (ref 11.5–15.5)
WBC: 10.5 10*3/uL (ref 4.0–10.5)
nRBC: 0 % (ref 0.0–0.2)
nRBC: 0 /100 WBC

## 2020-09-22 LAB — COMPREHENSIVE METABOLIC PANEL
ALT: 27 U/L (ref 0–44)
AST: 37 U/L (ref 15–41)
Albumin: 2.6 g/dL — ABNORMAL LOW (ref 3.5–5.0)
Alkaline Phosphatase: 71 U/L (ref 38–126)
Anion gap: 14 (ref 5–15)
BUN: 63 mg/dL — ABNORMAL HIGH (ref 8–23)
CO2: 21 mmol/L — ABNORMAL LOW (ref 22–32)
Calcium: 8.5 mg/dL — ABNORMAL LOW (ref 8.9–10.3)
Chloride: 101 mmol/L (ref 98–111)
Creatinine, Ser: 2.2 mg/dL — ABNORMAL HIGH (ref 0.61–1.24)
GFR, Estimated: 29 mL/min — ABNORMAL LOW (ref >60)
Glucose, Bld: 223 mg/dL — ABNORMAL HIGH (ref 70–99)
Potassium: 3.9 mmol/L (ref 3.5–5.1)
Sodium: 136 mmol/L (ref 135–145)
Total Bilirubin: 0.4 mg/dL (ref 0.3–1.2)
Total Protein: 6.6 g/dL (ref 6.5–8.1)

## 2020-09-22 LAB — GLUCOSE, RANDOM: Glucose, Bld: 416 mg/dL — ABNORMAL HIGH (ref 70–99)

## 2020-09-22 LAB — D-DIMER, QUANTITATIVE: D-Dimer, Quant: 4.36 ug/mL-FEU — ABNORMAL HIGH (ref 0.00–0.50)

## 2020-09-22 LAB — GLUCOSE, CAPILLARY
Glucose-Capillary: 273 mg/dL — ABNORMAL HIGH (ref 70–99)
Glucose-Capillary: 405 mg/dL — ABNORMAL HIGH (ref 70–99)
Glucose-Capillary: 359 mg/dL — ABNORMAL HIGH (ref 70–99)
Glucose-Capillary: 288 mg/dL — ABNORMAL HIGH (ref 70–99)

## 2020-09-22 LAB — FERRITIN: Ferritin: 975 ng/mL — ABNORMAL HIGH (ref 24–336)

## 2020-09-22 LAB — C-REACTIVE PROTEIN: CRP: 21.1 mg/dL — ABNORMAL HIGH (ref <1.0)

## 2020-09-22 MED ORDER — INSULIN ASPART 100 UNIT/ML IJ SOLN
5.0000 [IU] | Freq: Once | INTRAMUSCULAR | Status: AC
  Administered 2020-09-22: 5 [IU] via SUBCUTANEOUS

## 2020-09-22 MED ORDER — INSULIN ASPART 100 UNIT/ML IJ SOLN
4.0000 [IU] | Freq: Three times a day (TID) | INTRAMUSCULAR | Status: DC
  Administered 2020-09-22 – 2020-09-24 (×6): 4 [IU] via SUBCUTANEOUS

## 2020-09-22 MED ORDER — INSULIN GLARGINE 100 UNIT/ML ~~LOC~~ SOLN
10.0000 [IU] | Freq: Every day | SUBCUTANEOUS | Status: DC
  Administered 2020-09-22: 10 [IU] via SUBCUTANEOUS
  Filled 2020-09-22 (×2): qty 0.1

## 2020-09-22 NOTE — Progress Notes (Signed)
Pt 2 assist with walker up to chair.

## 2020-09-22 NOTE — Progress Notes (Signed)
CSW received SNF consult; left voicemail for patient's daughter, Marzetta Board.   Gilmore Laroche, MSW, [facility]

## 2020-09-22 NOTE — TOC Initial Note (Signed)
Transition of Care [facility]) - Initial/Assessment Note    Patient Details  Name: Joel Holloway MRN: [id number] Date of Birth: 1934/11/04  Transition of Care [facility]) CM/SW Contact:    Benard Halsted, LCSW Phone Number: 09/22/2020, 10:09 AM  Clinical Narrative:                 CSW received consult for possible SNF placement at time of discharge. CSW spoke with patient's daughter, Marzetta Board. She reported that patient resides at [facility]. He receives a Therapist, sports (can remind him to take meds but no hands on work) Monday through Friday 8:30am-11am and is able to have meals delivered to his room if he cannot go to their cafeteria. She expressed understanding of PT recommendation and is agreeable to SNF placement at time of discharge, though she will have to figure out a plan to care for his blind dog. CSW discussed insurance authorization process and provided Medicare SNF ratings list; she is aware that there is only one COVID accepting facility in [place] at this time. No further questions reported at this time.    Expected Discharge Plan: Skilled Nursing Facility Barriers to Discharge: Continued Medical Work up   Patient Goals and CMS Choice Patient states their goals for this hospitalization and ongoing recovery are:: Rehab CMS Medicare.gov Compare Post Acute Care list provided to:: Patient Represenative (must comment) Choice offered to / list presented to : Adult Children  Expected Discharge Plan and Services Expected Discharge Plan: [place] In-house Referral: Clinical Social Work   Post Acute Care Choice: [place] Living arrangements for the past 2 months: [place]                                      Prior Living Arrangements/Services Living arrangements for the past 2 months: [place] Lives with:: Self Patient language and need for interpreter reviewed:: Yes Do you  feel safe going back to the place where you live?: Yes      Need for Family Participation in Patient Care: Yes (Comment) Care giver support system in place?: Yes (comment) Current home services: DME,Sitter,Housekeeping,Meals on wheels Criminal Activity/Legal Involvement Pertinent to Current Situation/Hospitalization: No - Comment as needed  Activities of Daily Living Home Assistive Devices/Equipment: Gilford Rile (specify type) ADL Screening (condition at time of admission) Patient's cognitive ability adequate to safely complete daily activities?: No Is the patient deaf or have difficulty hearing?: Yes Does the patient have difficulty seeing, even when wearing glasses/contacts?: No Does the patient have difficulty concentrating, remembering, or making decisions?: Yes Patient able to express need for assistance with ADLs?: Yes Does the patient have difficulty dressing or bathing?: Yes Independently performs ADLs?: No Communication: Independent Dressing (OT): Needs assistance Is this a change from baseline?: Change from baseline, expected to last <3days Grooming: Needs assistance Is this a change from baseline?: Change from baseline, expected to last <3 days Feeding: Needs assistance (setup) Is this a change from baseline?: Change from baseline, expected to last <3 days Bathing: Needs assistance Is this a change from baseline?: Change from baseline, expected to last <3 days Toileting: Needs assistance Is this a change from baseline?: Change from baseline, expected to last <3 days In/Out Bed: Needs assistance Is this a change from baseline?: Change from baseline, expected to last <3 days Walks in Home: Needs assistance Is this a change from baseline?: Change from  baseline, expected to last <3 days Does the patient have difficulty walking or climbing stairs?: Yes Weakness of Legs: Both Weakness of Arms/Hands: Both  Permission Sought/Granted Permission sought to share information with :  Facility Contact Representative,Family Supports Permission granted to share information with : No  Share Information with NAME: Marzetta Board  Permission granted to share info w AGENCY: SNFs  Permission granted to share info w Relationship: Daughter  Permission granted to share info w Contact Information: [phone]  Emotional Assessment Appearance:: Appears stated age Attitude/Demeanor/Rapport: Unable to Assess Affect (typically observed): Unable to Assess Orientation: : Oriented to Self Alcohol / Substance Use: Not Applicable Psych Involvement: No (comment)  Admission diagnosis:  Hypoxia [R09.02] COVID-19 [U07.1] Patient Active Problem List   Diagnosis Date Noted  . COVID-19 09/19/2020  . Stroke ([place])   . Prostate cancer ([place])   . Myocardial infarction ([place])   . Hypertension   . Diabetes mellitus without complication ([place])   . Atrial fibrillation ([place])    PCP:  [name], DO Pharmacy:   [facility] [place], [place] - [address] AT [address] [place] [address] Phone: [phone] Fax: [phone]  [facility] [place] [address] [facility] Portal to Registered [employer] Sites [place] [address] Phone: [phone] Fax: [phone]     Social Determinants of Health (SDOH) Interventions    Readmission Risk Interventions No flowsheet data found.

## 2020-09-22 NOTE — Progress Notes (Signed)
PROGRESS NOTE    Joel Holloway  ZOX:[id number] DOB: 09/02/1934 DOA: 09/05/2020 PCP: [name], DO   Brief Narrative:   Joel Holloway is a 86 y.o. male with medical history significant of CVA; prostate CA; CAD; HTN; DM; and afib on AC presenting with worsening COVID symptoms.   Patient tested positive for COVID approximately 8 days ago with worsening symptoms of respiratory distress with exertion, dyspnea at rest, sore throat, rhinorrhea, cough congestion and generalized fatigue.  He also notes extremely poor p.o. intake over the past 5 to 6 days.  In the ED patient was noted to be 86% on room air at rest requiring upwards of 3 to 4 L nasal cannula to maintain sats above 90%.  It does not appear he had any outpatient treatment for COVID-19 pneumonia prior to admission.  Subjective:  No significant events overnight, he reports some dyspnea, cough, denies any nausea, vomiting, fever or chills.   Assessment & Plan:   Principal Problem:   COVID-19 Active Problems:   Stroke [facility])   Prostate cancer ([place])   Hypertension   Diabetes mellitus without complication ([place])   Atrial fibrillation ([place])   Acute respiratory failure with hypoxia due to COVID-19 PNA, POA Cannot rule out concurrent CAP, POA SpO2: 98 % O2 Flow Rate (L/min): 4 L/min - CXR personally reviewed, bibasilar atelectasis versus infiltrate consistent with community-acquired pneumonia > COVID - Continue azith/ceftriaixone for potential underlying CAP given procalcitonin >0.5.   - Continue steroids and Remdesivir per protocol - Patient consented to Actemra/similar if clinically deteriorates - Prone, IS/flutter, early ambulation as tolerated - PT/OT consults -He remains on 4 to 5 L nasal cannula with no escalation of oxygen requirement, will keep all decline Actemra, will continue with steroids and remdesivir. -Continue to trend inflammatory markers, CRP and D-dimer significantly elevated, will continue to monitor  closely. DM, well-controlled non-insulin-dependent Lab Results  Component Value Date   HGBA1C 5.7 (H) 09/21/2020  - Continue sliding scale insulin, hypoglycemic protocol - Hold Glucotrol -CBG significantly elevated in the 400s, this is most likely due to steroids use, I will add 4 units before meal NovoLog and start continuous of Lantus, and increase as needed.  HTN - Continue Toprol XL  HLD - Continue Zocor  Afib - Rate control with Toprol - Continue Xarelto -discussed possibility of transition to Eliquis in the near future given CKD 3B  Stage 3b CKD -Appears to be similar to prior ER visit on 3/12 -Needs ongoing monitoring  Obesity -BMI is 32.5 -Weight loss should be encouraged -Outpatient PCP/bariatric medicine f/u encouraged   DVT prophylaxis:  Lovenox  Code Status:  DNR - confirmed with patient Family Communication: Lengthy discussion with daughter stacy  over the phone  Status is: Inpatient  Dispo: The patient is from: Home (PT/HH/Caretaker)              Anticipated d/c is to: Home (resume HH/PT caretaker) vs SNF              Anticipated d/c date is: >72h              Patient currently not medically stable for discharge  Consultants:   None  Procedures:   None  Antimicrobials:  Azithromycin, ceftriaxone x5 days, tentative stop date 09/24/2020    Objective: Vitals:   09/21/20 0425 09/21/20 1434 09/21/20 2140 09/22/20 0409  BP: 123/68 113/75 102/61 (!) 139/94  Pulse: 79 89 74 65  Resp: 19  20 18   Temp: 98 F (36.7 C)  97.8 F (36.6 C) 97.9 F (36.6 C) 97.7 F (36.5 C)  TempSrc: Axillary Oral Axillary Axillary  SpO2: 96%  97% 98%  Weight:      Height:        Intake/Output Summary (Last 24 hours) at 09/22/2020 1558 Last data filed at 09/21/2020 2229 Gross per 24 hour  Intake 360 ml  Output --  Net 360 ml   Filed Weights   09/18/2020 1500  Weight: 97.1 kg    Examination:   Awake Alert, Oriented X 3, No new F.N deficits, Normal  affect, frail  Symmetrical Chest wall movement, Good air movement bilaterally, CTAB RRR,No Gallops,Rubs or new Murmurs, No Parasternal Heave +ve B.Sounds, Abd Soft, No tenderness, No rebound - guarding or rigidity. No Cyanosis, Clubbing or edema, No new Rash or bruise       Data Reviewed: I have personally reviewed following labs and imaging studies  CBC: Recent Labs  Lab 08/29/2020 0909 09/21/20 0357 09/22/20 0028  WBC 10.7* 7.5 10.5  NEUTROABS 9.3* 6.8 10.0*  HGB 12.1* 11.5* 11.6*  HCT 38.3* 36.3* 37.0*  MCV 86.3 86.4 86.7  PLT 603* 570* 709*   Basic Metabolic Panel: Recent Labs  Lab 09/02/2020 0909 09/21/20 0357 09/22/20 0028 09/22/20 1339  NA 135 136 136  --   K 3.8 4.0 3.9  --   CL 103 101 101  --   CO2 19* 21* 21*  --   GLUCOSE 158* 277* 223* 416*  BUN 47* 53* 63*  --   CREATININE 2.20* 2.28* 2.20*  --   CALCIUM 8.3* 8.1* 8.5*  --   MG  --  2.2  --   --   PHOS  --  4.2  --   --    GFR: Estimated Creatinine Clearance: 28.7 mL/min (A) (by C-G formula based on SCr of 2.2 mg/dL (H)). Liver Function Tests: Recent Labs  Lab 09/07/2020 0909 09/21/20 0357 09/22/20 0028  AST 35 42* 37  ALT 23 30 27   ALKPHOS 65 64 71  BILITOT 1.2 0.6 0.4  PROT 6.9 6.5 6.6  ALBUMIN 2.8* 2.6* 2.6*   No results for input(s): LIPASE, AMYLASE in the last 168 hours. No results for input(s): AMMONIA in the last 168 hours. Coagulation Profile: Recent Labs  Lab 08/26/2020 0909  INR 1.5*   Cardiac Enzymes: No results for input(s): CKTOTAL, CKMB, CKMBINDEX, TROPONINI in the last 168 hours. BNP (last 3 results) No results for input(s): PROBNP in the last 8760 hours. HbA1C: Recent Labs    08/27/2020 0909  HGBA1C 5.7*   CBG: Recent Labs  Lab 09/21/20 1141 09/21/20 1600 09/21/20 2143 09/22/20 0729 09/22/20 1225  GLUCAP 352* 325* 208* 273* 405*   Lipid Profile: Recent Labs    09/12/2020 0909  TRIG 79   Thyroid Function Tests: No results for input(s): TSH, T4TOTAL, FREET4,  T3FREE, THYROIDAB in the last 72 hours. Anemia Panel: Recent Labs    09/21/20 0357 09/22/20 0028  FERRITIN 898* 975*   Sepsis Labs: Recent Labs  Lab 08/23/2020 0909 09/08/2020 1138  PROCALCITON 0.72  --   LATICACIDVEN 1.6 1.5    Recent Results (from the past 240 hour(s))  Blood Culture (routine x 2)     Status: None (Preliminary result)   Collection Time: 08/30/2020  9:05 AM   Specimen: BLOOD  Result Value Ref Range Status   Specimen Description BLOOD BLOOD LEFT WRIST  Final   Special Requests   Final    BOTTLES DRAWN AEROBIC AND  ANAEROBIC Blood Culture results may not be optimal due to an excessive volume of blood received in culture bottles   Culture   Final    NO GROWTH 2 DAYS Performed at [facility] [address], [place], [place] [address]    Report Status PENDING  Incomplete  Blood Culture (routine x 2)     Status: None (Preliminary result)   Collection Time: 08/29/2020  9:10 AM   Specimen: BLOOD  Result Value Ref Range Status   Specimen Description BLOOD BLOOD RIGHT HAND  Final   Special Requests   Final    BOTTLES DRAWN AEROBIC AND ANAEROBIC Blood Culture adequate volume   Culture   Final    NO GROWTH 2 DAYS Performed at [facility], [place] [address] [place], [place] [address]    Report Status PENDING  Incomplete         Radiology Studies: No results found.      Scheduled Meds: . alfuzosin  10 mg Oral q morning  . vitamin C  500 mg Oral Daily  . dicyclomine  20 mg Oral BID  . docusate sodium  100 mg Oral BID  . FLUoxetine  10 mg Oral q morning  . insulin aspart  0-15 Units Subcutaneous TID WC  . insulin aspart  0-5 Units Subcutaneous QHS  . levETIRAcetam  500 mg Oral q morning  . levothyroxine  25 mcg Oral Q0600  . methylPREDNISolone (SOLU-MEDROL) injection  50 mg Intravenous BID   Followed by  . [START ON 09/23/2020] predniSONE  50 mg Oral Daily  . metoprolol succinate  100 mg Oral q morning  . Rivaroxaban  15 mg Oral q morning  .  simvastatin  10 mg Oral QPM  . sodium chloride flush  3 mL Intravenous Q12H  . sodium chloride flush  3 mL Intravenous Q12H  . zinc sulfate  220 mg Oral Daily   Continuous Infusions: . sodium chloride    . azithromycin 500 mg (09/22/20 1319)  . cefTRIAXone (ROCEPHIN)  IV 2 g (09/22/20 1320)  . remdesivir 100 mg in NS 100 mL 100 mg (09/22/20 0948)     LOS: 2 days    Phillips Climes, MD [facility]  If 7PM-7AM, please contact night-coverage [email or web address]  09/22/2020, 3:58 PM

## 2020-09-22 NOTE — Progress Notes (Signed)
Secure chat and page sent to Dr. Trellis Moment about pt's blood sugar result. STAT lab placed as standing order suggests and waiting for further instruction.

## 2020-09-22 NOTE — NC FL2 (Signed)
[place] LEVEL OF CARE SCREENING TOOL     IDENTIFICATION  Patient Name: Joel Holloway Birthdate: [birth year] Sex: male Admission Date (Current Location): 09/03/2020  [facility] and [place] Number:  Herbalist and Address:  [facility], [place] [address] [place], [place] [address]      Provider Number: [id number]  Attending Physician Name and Address:  Albertine Patricia, MD  Relative Name and Phone Number:  Gwendalyn Ege    Current Level of Care: Hospital Recommended Level of Care: [place] Prior Approval Number:    Date Approved/Denied:   PASRR Number: [id number]  Discharge Plan: SNF    Current Diagnoses: Patient Active Problem List   Diagnosis Date Noted  . COVID-19 09/19/2020  . Stroke ([place])   . Prostate cancer ([place])   . Myocardial infarction ([place])   . Hypertension   . Diabetes mellitus without complication ([place])   . Atrial fibrillation ([facility])     Orientation RESPIRATION BLADDER Height & Weight     Self,Place  O2 (Nasal cannula 4L) Incontinent Weight: 214 lb (97.1 kg) Height:  5\' 9"  (175.3 cm)  BEHAVIORAL SYMPTOMS/MOOD NEUROLOGICAL BOWEL NUTRITION STATUS      Incontinent Diet (Please see DC Summary)  AMBULATORY STATUS COMMUNICATION OF NEEDS Skin   Limited Assist Verbally Normal                       Personal Care Assistance Level of Assistance  Bathing,Feeding,Dressing Bathing Assistance: Maximum assistance Feeding assistance: Limited assistance Dressing Assistance: Limited assistance     Functional Limitations Info  Hearing   Hearing Info: Impaired      SPECIAL CARE FACTORS FREQUENCY  PT (By licensed PT),OT (By licensed OT)     PT Frequency: 5x/week OT Frequency: 5x/week            Contractures Contractures Info: Not present    Additional Factors Info  Code Status,Allergies,Isolation Precautions Code Status Info: DNR Allergies Info: NKA      Isolation Precautions Info: COVID+ on 09/16/20     Current Medications (09/22/2020):  This is the current hospital active medication list Current Facility-Administered Medications  Medication Dose Route Frequency Provider Last Rate Last Admin  . 0.9 %  sodium chloride infusion  250 mL Intravenous PRN Karmen Bongo, MD      . acetaminophen (TYLENOL) tablet 650 mg  650 mg Oral Q6H PRN Karmen Bongo, MD   650 mg at 09/21/20 2207  . albuterol (VENTOLIN HFA) 108 (90 Base) MCG/ACT inhaler 2 puff  2 puff Inhalation Q2H PRN Karmen Bongo, MD   2 puff at 09/22/20 0957  . alfuzosin (UROXATRAL) 24 hr tablet 10 mg  10 mg Oral q morning Karmen Bongo, MD   10 mg at 09/22/20 1000  . ascorbic acid (VITAMIN C) tablet 500 mg  500 mg Oral Daily Karmen Bongo, MD   500 mg at 09/22/20 1000  . azithromycin (ZITHROMAX) 500 mg in sodium chloride 0.9 % 250 mL IVPB  500 mg Intravenous Q24H Karmen Bongo, MD 250 mL/hr at 09/22/20 1319 500 mg at 09/22/20 1319  . bisacodyl (DULCOLAX) EC tablet 5 mg  5 mg Oral Daily PRN Karmen Bongo, MD      . cefTRIAXone (ROCEPHIN) 2 g in sodium chloride 0.9 % 100 mL IVPB  2 g Intravenous Q24H Karmen Bongo, MD 200 mL/hr at 09/22/20 1320 2 g at 09/22/20 1320  . chlorpheniramine-HYDROcodone (TUSSIONEX) 10-8 MG/5ML suspension 5 mL  5 mL Oral Q12H PRN Karmen Bongo, MD      . dicyclomine (BENTYL) tablet 20 mg  20 mg Oral BID Karmen Bongo, MD   20 mg at 09/22/20 0959  . docusate sodium (COLACE) capsule 100 mg  100 mg Oral BID Karmen Bongo, MD   100 mg at 09/21/20 2207  . FLUoxetine (PROZAC) capsule 10 mg  10 mg Oral q morning Karmen Bongo, MD   10 mg at 09/22/20 0959  . guaiFENesin-dextromethorphan (ROBITUSSIN DM) 100-10 MG/5ML syrup 10 mL  10 mL Oral Q4H PRN Karmen Bongo, MD   10 mL at 09/21/20 2206  . insulin aspart (novoLOG) injection 0-15 Units  0-15 Units Subcutaneous TID WC Karmen Bongo, MD   15 Units at 09/22/20 1315  . insulin aspart (novoLOG) injection  0-5 Units  0-5 Units Subcutaneous QHS Karmen Bongo, MD   2 Units at 09/21/20 2234  . levETIRAcetam (KEPPRA) tablet 500 mg  500 mg Oral q morning Karmen Bongo, MD   500 mg at 09/22/20 0959  . levothyroxine (SYNTHROID) tablet 25 mcg  25 mcg Oral Q0600 Karmen Bongo, MD   25 mcg at 09/22/20 [phone]  . methylPREDNISolone sodium succinate (SOLU-MEDROL) 125 mg/2 mL injection 50 mg  50 mg Intravenous BID Karmen Bongo, MD   50 mg at 09/22/20 1000   Followed by  . [START ON 09/23/2020] predniSONE (DELTASONE) tablet 50 mg  50 mg Oral Daily Karmen Bongo, MD      . metoprolol succinate (TOPROL-XL) 24 hr tablet 100 mg  100 mg Oral q morning Karmen Bongo, MD   100 mg at 09/22/20 1000  . ondansetron (ZOFRAN) tablet 4 mg  4 mg Oral Q6H PRN Karmen Bongo, MD   4 mg at 09/21/20 2207   Or  . ondansetron [facility]) injection 4 mg  4 mg Intravenous Q6H PRN Karmen Bongo, MD      . oxyCODONE (Oxy IR/ROXICODONE) immediate release tablet 5 mg  5 mg Oral Q4H PRN Karmen Bongo, MD      . polyethylene glycol (MIRALAX / GLYCOLAX) packet 17 g  17 g Oral Daily PRN Karmen Bongo, MD      . remdesivir 100 mg in sodium chloride 0.9 % 100 mL IVPB  100 mg Intravenous Daily Karmen Bongo, MD 200 mL/hr at 09/22/20 0948 100 mg at 09/22/20 0948  . Rivaroxaban (XARELTO) tablet 15 mg  15 mg Oral q morning Karmen Bongo, MD   15 mg at 09/22/20 1000  . simvastatin (ZOCOR) tablet 10 mg  10 mg Oral QPM Karmen Bongo, MD   10 mg at 09/21/20 1610  . sodium chloride flush (NS) 0.9 % injection 3 mL  3 mL Intravenous Q12H Karmen Bongo, MD   3 mL at 09/22/20 1001  . sodium chloride flush (NS) 0.9 % injection 3 mL  3 mL Intravenous Q12H Karmen Bongo, MD   3 mL at 09/22/20 1001  . sodium chloride flush (NS) 0.9 % injection 3 mL  3 mL Intravenous PRN Karmen Bongo, MD      . sodium phosphate (FLEET) 7-19 GM/118ML enema 1 enema  1 enema Rectal Once PRN Karmen Bongo, MD      . zinc sulfate capsule 220 mg  220 mg Oral  Daily Karmen Bongo, MD   220 mg at 09/22/20 5361     Discharge Medications: Please see discharge summary for a list of discharge medications.  Relevant Imaging Results:  Relevant Lab Results:   Additional Information SSN: [id number]. Only  one Moderna vaccine on file 03/01/20  Benard Halsted, LCSW

## 2020-09-22 DEATH — deceased

## 2020-09-23 LAB — BRAIN NATRIURETIC PEPTIDE: B Natriuretic Peptide: 137.7 pg/mL — ABNORMAL HIGH (ref 0.0–100.0)

## 2020-09-23 LAB — GLUCOSE, CAPILLARY
Glucose-Capillary: 193 mg/dL — ABNORMAL HIGH (ref 70–99)
Glucose-Capillary: 279 mg/dL — ABNORMAL HIGH (ref 70–99)
Glucose-Capillary: 293 mg/dL — ABNORMAL HIGH (ref 70–99)
Glucose-Capillary: 326 mg/dL — ABNORMAL HIGH (ref 70–99)

## 2020-09-23 LAB — COMPREHENSIVE METABOLIC PANEL
ALT: 24 U/L (ref 0–44)
AST: 23 U/L (ref 15–41)
Albumin: 2.5 g/dL — ABNORMAL LOW (ref 3.5–5.0)
Alkaline Phosphatase: 73 U/L (ref 38–126)
Anion gap: 11 (ref 5–15)
BUN: 65 mg/dL — ABNORMAL HIGH (ref 8–23)
CO2: 21 mmol/L — ABNORMAL LOW (ref 22–32)
Calcium: 8.3 mg/dL — ABNORMAL LOW (ref 8.9–10.3)
Chloride: 105 mmol/L (ref 98–111)
Creatinine, Ser: 2.14 mg/dL — ABNORMAL HIGH (ref 0.61–1.24)
GFR, Estimated: 30 mL/min — ABNORMAL LOW (ref >60)
Glucose, Bld: 248 mg/dL — ABNORMAL HIGH (ref 70–99)
Potassium: 4 mmol/L (ref 3.5–5.1)
Sodium: 137 mmol/L (ref 135–145)
Total Bilirubin: 0.5 mg/dL (ref 0.3–1.2)
Total Protein: 6.1 g/dL — ABNORMAL LOW (ref 6.5–8.1)

## 2020-09-23 LAB — CBC WITH DIFFERENTIAL/PLATELET
Abs Immature Granulocytes: 0 10*3/uL (ref 0.00–0.07)
Basophils Absolute: 0 10*3/uL (ref 0.0–0.1)
Basophils Relative: 0 %
Eosinophils Absolute: 0 10*3/uL (ref 0.0–0.5)
Eosinophils Relative: 0 %
HCT: 36.2 % — ABNORMAL LOW (ref 39.0–52.0)
Hemoglobin: 11.4 g/dL — ABNORMAL LOW (ref 13.0–17.0)
Lymphocytes Relative: 1 %
Lymphs Abs: 0.1 10*3/uL — ABNORMAL LOW (ref 0.7–4.0)
MCH: 27.4 pg (ref 26.0–34.0)
MCHC: 31.5 g/dL (ref 30.0–36.0)
MCV: 87 fL (ref 80.0–100.0)
Monocytes Absolute: 0.4 10*3/uL (ref 0.1–1.0)
Monocytes Relative: 4 %
Neutro Abs: 9.8 10*3/uL — ABNORMAL HIGH (ref 1.7–7.7)
Neutrophils Relative %: 95 %
Platelets: 674 10*3/uL — ABNORMAL HIGH (ref 150–400)
RBC: 4.16 MIL/uL — ABNORMAL LOW (ref 4.22–5.81)
RDW: 16.4 % — ABNORMAL HIGH (ref 11.5–15.5)
WBC: 10.3 10*3/uL (ref 4.0–10.5)
nRBC: 0 % (ref 0.0–0.2)
nRBC: 1 /100 WBC — ABNORMAL HIGH

## 2020-09-23 LAB — C-REACTIVE PROTEIN: CRP: 11.3 mg/dL — ABNORMAL HIGH (ref <1.0)

## 2020-09-23 LAB — FERRITIN: Ferritin: 708 ng/mL — ABNORMAL HIGH (ref 24–336)

## 2020-09-23 LAB — PROCALCITONIN: Procalcitonin: 0.29 ng/mL

## 2020-09-23 LAB — D-DIMER, QUANTITATIVE: D-Dimer, Quant: 2.96 ug/mL-FEU — ABNORMAL HIGH (ref 0.00–0.50)

## 2020-09-23 MED ORDER — METHYLPREDNISOLONE SODIUM SUCC 125 MG IJ SOLR
60.0000 mg | Freq: Three times a day (TID) | INTRAMUSCULAR | Status: DC
  Administered 2020-09-23 – 2020-09-26 (×9): 60 mg via INTRAVENOUS
  Filled 2020-09-23 (×8): qty 2

## 2020-09-23 MED ORDER — PREDNISONE 20 MG PO TABS: 50.0000 mg | ORAL_TABLET | Freq: Every day | ORAL | Status: DC

## 2020-09-23 MED ORDER — TOCILIZUMAB 400 MG/20ML IV SOLN
8.0000 mg/kg | Freq: Once | INTRAVENOUS | Status: AC
  Administered 2020-09-23: 776 mg via INTRAVENOUS
  Filled 2020-09-23: qty 38.8

## 2020-09-23 MED ORDER — INSULIN GLARGINE 100 UNIT/ML ~~LOC~~ SOLN
14.0000 [IU] | Freq: Every day | SUBCUTANEOUS | Status: DC
  Administered 2020-09-23 – 2020-09-24 (×2): 14 [IU] via SUBCUTANEOUS
  Filled 2020-09-23 (×2): qty 0.14

## 2020-09-23 NOTE — Progress Notes (Signed)
PROGRESS NOTE    Joel Holloway  KPT:[id number] DOB: [birth year] DOA: 09/19/2020 PCP: [name], DO   Brief Narrative:   Joel Holloway is a 86 y.o. male with medical history significant of CVA; prostate CA; CAD; HTN; DM; and afib on AC presenting with worsening COVID symptoms.   Patient tested positive for COVID approximately 8 days ago with worsening symptoms of respiratory distress with exertion, dyspnea at rest, sore throat, rhinorrhea, cough congestion and generalized fatigue.  He also notes extremely poor p.o. intake over the past 5 to 6 days.  In the ED patient was noted to be 86% on room air at rest requiring upwards of 3 to 4 L nasal cannula to maintain sats above 90%.  It does not appear he had any outpatient treatment for COVID-19 pneumonia prior to admission.  Subjective:  No significant events overnight, he still reports some dyspnea, cough, he denies any fever, chills or chest pain.     Assessment & Plan:   Principal Problem:   COVID-19 Active Problems:   Stroke [facility])   Prostate cancer ([place])   Hypertension   Diabetes mellitus without complication ([place])   Atrial fibrillation ([place])   Acute respiratory failure with hypoxia due to COVID-19 PNA, POA Cannot rule out concurrent CAP, POA SpO2: 93 % O2 Flow Rate (L/min): 4 L/min - CXR personally reviewed, bibasilar atelectasis versus infiltrate consistent with community-acquired pneumonia > COVID - Continue azith/ceftriaixone for potential underlying CAP given procalcitonin >0.5.   - Continue steroids and Remdesivir per protocol, increase Solu-Medrol to 60 mg every 8 hours. - Patient consented to Actemra/similar if clinically deteriorates - Prone, IS/flutter, early ambulation as tolerated - PT/OT consults -He is with increased oxygen requirement, this morning he is on 9 L nasal cannula despite being treated with remdesivir, and steroids, I have discussed Actemra with the patient, and daughter, no history of TB,  hepatitis B, C, no diverticulitis or malignancy, they are agreeable to proceed with Actemra, pharmacy consulted to dose Actemra.   -Continue to trend inflammatory markers, CRP and D-dimer significantly elevated, but trending down, will continue to monitor closely. DM, well-controlled non-insulin-dependent Lab Results  Component Value Date   HGBA1C 5.7 (H) 08/25/2020  - Continue sliding scale insulin, hypoglycemic protocol - Hold Glucotrol -CBG significantly elevated, dissing in the setting of IV steroids, increase Lantus to 14 units, continue 4 units before meal NovoLog and start continuous of Lantus, and increase as needed.  HTN - Continue Toprol XL  HLD - Continue Zocor  Afib - Rate control with Toprol - Continue Xarelto -discussed possibility of transition to Eliquis in the near future given CKD 3B  Stage 3b CKD -Appears to be similar to prior ER visit on 3/12 -Needs ongoing monitoring  Obesity -BMI is 32.5 -Weight loss should be encouraged -Outpatient PCP/bariatric medicine f/u encouraged   DVT prophylaxis:  Lovenox  Code Status:  DNR - confirmed with patient Family Communication: Lengthy discussion with daughter stacy  over the phone  Status is: Inpatient  Dispo: The patient is from: Home (PT/HH/Caretaker)              Anticipated d/c is to:SNF              Anticipated d/c date is: >72h              Patient currently not medically stable for discharge  Consultants:   None  Procedures:   None  Antimicrobials:  Azithromycin, ceftriaxone x5 days, tentative stop date 09/24/2020  Objective: Vitals:   09/22/20 0409 09/22/20 1616 09/23/20 0415 09/23/20 0843  BP: (!) 139/94 115/63 (!) 105/93   Pulse: 65 (!) 59 (!) 54 (!) 59  Resp: 18 20 16    Temp: 97.7 F (36.5 C)  98.9 F (37.2 C)   TempSrc: Axillary  Axillary   SpO2: 98% 91% 93%   Weight:      Height:       No intake or output data in the 24 hours ending 09/23/20 1156 Filed Weights    09/10/2020 1500  Weight: 97.1 kg    Examination:   Awake Alert, frail,  No new F.N deficits, Normal affect Symmetrical Chest wall movement, Good air movement bilaterally, scattered rales RRR,No Gallops,Rubs or new Murmurs, No Parasternal Heave +ve B.Sounds, Abd Soft, No tenderness, No rebound - guarding or rigidity. No Cyanosis, Clubbing or edema, No new Rash or bruise        Data Reviewed: I have personally reviewed following labs and imaging studies  CBC: Recent Labs  Lab 08/25/2020 0909 09/21/20 0357 09/22/20 0028 09/23/20 0136  WBC 10.7* 7.5 10.5 10.3  NEUTROABS 9.3* 6.8 10.0* 9.8*  HGB 12.1* 11.5* 11.6* 11.4*  HCT 38.3* 36.3* 37.0* 36.2*  MCV 86.3 86.4 86.7 87.0  PLT 603* 570* 641* 277*   Basic Metabolic Panel: Recent Labs  Lab 09/18/2020 0909 09/21/20 0357 09/22/20 0028 09/22/20 1339 09/23/20 0136  NA 135 136 136  --  137  K 3.8 4.0 3.9  --  4.0  CL 103 101 101  --  105  CO2 19* 21* 21*  --  21*  GLUCOSE 158* 277* 223* 416* 248*  BUN 47* 53* 63*  --  65*  CREATININE 2.20* 2.28* 2.20*  --  2.14*  CALCIUM 8.3* 8.1* 8.5*  --  8.3*  MG  --  2.2  --   --   --   PHOS  --  4.2  --   --   --    GFR: Estimated Creatinine Clearance: 29.5 mL/min (A) (by C-G formula based on SCr of 2.14 mg/dL (H)). Liver Function Tests: Recent Labs  Lab 09/07/2020 0909 09/21/20 0357 09/22/20 0028 09/23/20 0136  AST 35 42* 37 23  ALT 23 30 27 24   ALKPHOS 65 64 71 73  BILITOT 1.2 0.6 0.4 0.5  PROT 6.9 6.5 6.6 6.1*  ALBUMIN 2.8* 2.6* 2.6* 2.5*   No results for input(s): LIPASE, AMYLASE in the last 168 hours. No results for input(s): AMMONIA in the last 168 hours. Coagulation Profile: Recent Labs  Lab 08/30/2020 0909  INR 1.5*   Cardiac Enzymes: No results for input(s): CKTOTAL, CKMB, CKMBINDEX, TROPONINI in the last 168 hours. BNP (last 3 results) No results for input(s): PROBNP in the last 8760 hours. HbA1C: No results for input(s): HGBA1C in the last 72  hours. CBG: Recent Labs  Lab 09/22/20 0729 09/22/20 1225 09/22/20 1614 09/22/20 2149 09/23/20 0803  GLUCAP 273* 405* 359* 288* 193*   Lipid Profile: No results for input(s): CHOL, HDL, LDLCALC, TRIG, CHOLHDL, LDLDIRECT in the last 72 hours. Thyroid Function Tests: No results for input(s): TSH, T4TOTAL, FREET4, T3FREE, THYROIDAB in the last 72 hours. Anemia Panel: Recent Labs    09/22/20 0028 09/23/20 0136  FERRITIN 975* 708*   Sepsis Labs: Recent Labs  Lab 09/05/2020 0909 09/11/2020 1138 09/23/20 0136  PROCALCITON 0.72  --  0.29  LATICACIDVEN 1.6 1.5  --     Recent Results (from the past 240 hour(s))  Blood Culture (  routine x 2)     Status: None (Preliminary result)   Collection Time: 08/26/2020  9:05 AM   Specimen: BLOOD  Result Value Ref Range Status   Specimen Description BLOOD BLOOD LEFT WRIST  Final   Special Requests   Final    BOTTLES DRAWN AEROBIC AND ANAEROBIC Blood Culture results may not be optimal due to an excessive volume of blood received in culture bottles   Culture   Final    NO GROWTH 3 DAYS Performed at [facility], [place] [address], [place], [place] [address]    Report Status PENDING  Incomplete  Blood Culture (routine x 2)     Status: None (Preliminary result)   Collection Time: 08/30/2020  9:10 AM   Specimen: BLOOD  Result Value Ref Range Status   Specimen Description BLOOD BLOOD RIGHT HAND  Final   Special Requests   Final    BOTTLES DRAWN AEROBIC AND ANAEROBIC Blood Culture adequate volume   Culture   Final    NO GROWTH 3 DAYS Performed at [facility], [place] [address], [place], [place] [address]    Report Status PENDING  Incomplete         Radiology Studies: No results found.      Scheduled Meds: . alfuzosin  10 mg Oral q morning  . vitamin C  500 mg Oral Daily  . dicyclomine  20 mg Oral BID  . docusate sodium  100 mg Oral BID  . FLUoxetine  10 mg Oral q morning  . insulin aspart  0-15 Units Subcutaneous TID WC  .  insulin aspart  0-5 Units Subcutaneous QHS  . insulin aspart  4 Units Subcutaneous TID WC  . insulin glargine  14 Units Subcutaneous Daily  . levETIRAcetam  500 mg Oral q morning  . levothyroxine  25 mcg Oral Q0600  . methylPREDNISolone (SOLU-MEDROL) injection  60 mg Intravenous Q8H   Followed by  . [START ON 09/30/2020] predniSONE  50 mg Oral Daily  . metoprolol succinate  100 mg Oral q morning  . Rivaroxaban  15 mg Oral q morning  . simvastatin  10 mg Oral QPM  . sodium chloride flush  3 mL Intravenous Q12H  . sodium chloride flush  3 mL Intravenous Q12H  . zinc sulfate  220 mg Oral Daily   Continuous Infusions: . sodium chloride    . azithromycin 500 mg (09/23/20 1145)  . cefTRIAXone (ROCEPHIN)  IV 2 g (09/23/20 1019)  . remdesivir 100 mg in NS 100 mL 100 mg (09/23/20 0848)     LOS: 3 days    Phillips Climes, MD [facility]  If 7PM-7AM, please contact night-coverage [email or web address]  09/23/2020, 11:56 AM

## 2020-09-23 NOTE — Progress Notes (Signed)
Physical Therapy Treatment Patient Details Name: Joel Holloway MRN: [id number] DOB: [birth year] Today's Date: 09/23/2020    History of Present Illness Pt is an 86 yo male s/p COVID.  + 8 days ago, on 5/22.  Mostly worsening weakness, also cough and SOB.  86% on RA with EMS.  Currently on 3L, 91-93%. PMHx: CVA; prostate CA; CAD; HTN; DM; and afib on AC    PT Comments    Pt pleasant and agreeable to participate with therapy. He ambulated to recliner chair in room with 1x LOB requiring assist to correct. Pt very dismissive of LOB and past falls. SpO2 dropped to 85% during ambulation. Cues given for pursed lipped breathing however SpO2 remained in the mid 80s with ~5 min rest. O2 increased from 9L to 10L. Pt with slight incontinence of bowls when coughing and requiring assist for peri care. SNF placement continues to remain appropriate. Will continue to follow acutely.    Follow Up Recommendations  SNF;Supervision for mobility/OOB     Equipment Recommendations  Rolling walker with 5" wheels;3in1 (PT);Wheelchair (measurements PT);Wheelchair cushion (measurements PT)    Recommendations for Other Services       Precautions / Restrictions Precautions Precautions: Fall;Other (comment) Precaution Comments: O2 chronic Restrictions Weight Bearing Restrictions: No    Mobility  Bed Mobility Overal bed mobility: Needs Assistance Bed Mobility: Supine to Sit     Supine to sit: HOB elevated;Min guard     General bed mobility comments: Increased time and effort with use of bed rails and HOB elevated.    Transfers Overall transfer level: Needs assistance Equipment used: Rolling walker (2 wheeled) Transfers: Sit to/from Stand Sit to Stand: Min assist         General transfer comment: min A for stability with increased time. Cues for safe hand placement with RW. Noted dizziness on standing that improved with time.  Ambulation/Gait Ambulation/Gait assistance: Min assist Gait Distance  (Feet): 12 Feet (around bed to chair) Assistive device: Rolling walker (2 wheeled) Gait Pattern/deviations: Step-through pattern;Decreased step length - right;Decreased step length - left;Wide base of support;Trunk flexed;Drifts right/left Gait velocity: decreased Gait velocity interpretation: <1.31 ft/sec, indicative of household ambulator General Gait Details: Unsteady with 1x LOB requiring mod A to correct. SpO2 decreased to 85% during ambulation. Cues for PLB. On 10L during gait.   Stairs             Wheelchair Mobility    Modified Rankin (Stroke Patients Only)       Balance Overall balance assessment: Needs assistance Sitting-balance support: Bilateral upper extremity supported;Feet supported Sitting balance-Leahy Scale: Fair     Standing balance support: Single extremity supported;During functional activity Standing balance-Leahy Scale: Poor Standing balance comment: use of external supports                            Cognition Arousal/Alertness: Awake/alert Behavior During Therapy: WFL for tasks assessed/performed Overall Cognitive Status: Impaired/Different from baseline                     Current Attention Level: Sustained Memory: Decreased short-term memory Following Commands: Follows one step commands consistently;Follows one step commands with increased time Safety/Judgement: Decreased awareness of safety;Decreased awareness of deficits Awareness: Intellectual Problem Solving: Slow processing General Comments: Orientation not formally assess this session. Noted some impulsivity and decreased safety awareness. Pt dismissive of past falls and LOB during session.      Exercises  General Comments General comments (skin integrity, edema, etc.): Pt with small amount of BM in bed and again when sitting in chair. Pt reports no need to have BM but states when he coughs it "just comes out". Assist required for peri care.       Pertinent Vitals/Pain Pain Assessment: No/denies pain    Home Living                      Prior Function            PT Goals (current goals can now be found in the care plan section) Acute Rehab PT Goals Patient Stated Goal: to get better PT Goal Formulation: With patient Time For Goal Achievement: 10/05/20 Potential to Achieve Goals: Fair Progress towards PT goals: Progressing toward goals    Frequency    Min 3X/week      PT Plan Current plan remains appropriate    Co-evaluation              AM-PAC PT "6 Clicks" Mobility   Outcome Measure  Help needed turning from your back to your side while in a flat bed without using bedrails?: A Little Help needed moving from lying on your back to sitting on the side of a flat bed without using bedrails?: A Little Help needed moving to and from a bed to a chair (including a wheelchair)?: A Little Help needed standing up from a chair using your arms (e.g., wheelchair or bedside chair)?: A Little Help needed to walk in hospital room?: A Little Help needed climbing 3-5 steps with a railing? : A Lot 6 Click Score: 17    End of Session Equipment Utilized During Treatment: Gait belt Activity Tolerance: Patient tolerated treatment well Patient left: with call bell/phone within reach;in chair;with chair alarm set Nurse Communication: Mobility status PT Visit Diagnosis: Unsteadiness on feet (R26.81);Difficulty in walking, not elsewhere classified (R26.2);Muscle weakness (generalized) (M62.81)     Time: 7673-4193 PT Time Calculation (min) (ACUTE ONLY): 36 min  Charges:  $Gait Training: 8-22 mins $Therapeutic Activity: 8-22 mins                     Benjiman Core, [place] Pager [phone] Acute Rehab   Allena Katz 09/23/2020, 11:17 AM

## 2020-09-24 LAB — CBC WITH DIFFERENTIAL/PLATELET
Abs Immature Granulocytes: 0 10*3/uL (ref 0.00–0.07)
Basophils Absolute: 0 10*3/uL (ref 0.0–0.1)
Basophils Relative: 0 %
Eosinophils Absolute: 0 10*3/uL (ref 0.0–0.5)
Eosinophils Relative: 0 %
HCT: 37 % — ABNORMAL LOW (ref 39.0–52.0)
Hemoglobin: 11.5 g/dL — ABNORMAL LOW (ref 13.0–17.0)
Lymphocytes Relative: 1 %
Lymphs Abs: 0.1 10*3/uL — ABNORMAL LOW (ref 0.7–4.0)
MCH: 26.9 pg (ref 26.0–34.0)
MCHC: 31.1 g/dL (ref 30.0–36.0)
MCV: 86.7 fL (ref 80.0–100.0)
Monocytes Absolute: 0.1 10*3/uL (ref 0.1–1.0)
Monocytes Relative: 1 %
Neutro Abs: 8.1 10*3/uL — ABNORMAL HIGH (ref 1.7–7.7)
Neutrophils Relative %: 98 %
Platelets: 712 10*3/uL — ABNORMAL HIGH (ref 150–400)
RBC: 4.27 MIL/uL (ref 4.22–5.81)
RDW: 16.3 % — ABNORMAL HIGH (ref 11.5–15.5)
WBC: 8.3 10*3/uL (ref 4.0–10.5)
nRBC: 0 % (ref 0.0–0.2)
nRBC: 0 /100 WBC

## 2020-09-24 LAB — COMPREHENSIVE METABOLIC PANEL
ALT: 26 U/L (ref 0–44)
AST: 25 U/L (ref 15–41)
Albumin: 2.6 g/dL — ABNORMAL LOW (ref 3.5–5.0)
Alkaline Phosphatase: 78 U/L (ref 38–126)
Anion gap: 10 (ref 5–15)
BUN: 63 mg/dL — ABNORMAL HIGH (ref 8–23)
CO2: 20 mmol/L — ABNORMAL LOW (ref 22–32)
Calcium: 8 mg/dL — ABNORMAL LOW (ref 8.9–10.3)
Chloride: 109 mmol/L (ref 98–111)
Creatinine, Ser: 2.07 mg/dL — ABNORMAL HIGH (ref 0.61–1.24)
GFR, Estimated: 31 mL/min — ABNORMAL LOW (ref >60)
Glucose, Bld: 303 mg/dL — ABNORMAL HIGH (ref 70–99)
Potassium: 4.3 mmol/L (ref 3.5–5.1)
Sodium: 139 mmol/L (ref 135–145)
Total Bilirubin: 0.3 mg/dL (ref 0.3–1.2)
Total Protein: 6.2 g/dL — ABNORMAL LOW (ref 6.5–8.1)

## 2020-09-24 LAB — D-DIMER, QUANTITATIVE: D-Dimer, Quant: 3.91 ug/mL-FEU — ABNORMAL HIGH (ref 0.00–0.50)

## 2020-09-24 LAB — C-REACTIVE PROTEIN: CRP: 7.9 mg/dL — ABNORMAL HIGH (ref <1.0)

## 2020-09-24 LAB — GLUCOSE, CAPILLARY
Glucose-Capillary: 319 mg/dL — ABNORMAL HIGH (ref 70–99)
Glucose-Capillary: 297 mg/dL — ABNORMAL HIGH (ref 70–99)
Glucose-Capillary: 307 mg/dL — ABNORMAL HIGH (ref 70–99)
Glucose-Capillary: 290 mg/dL — ABNORMAL HIGH (ref 70–99)

## 2020-09-24 LAB — FERRITIN: Ferritin: 677 ng/mL — ABNORMAL HIGH (ref 24–336)

## 2020-09-24 MED ORDER — INSULIN ASPART 100 UNIT/ML IJ SOLN
6.0000 [IU] | Freq: Three times a day (TID) | INTRAMUSCULAR | Status: DC
  Administered 2020-09-24 – 2020-09-26 (×5): 6 [IU] via SUBCUTANEOUS

## 2020-09-24 MED ORDER — INSULIN GLARGINE 100 UNIT/ML ~~LOC~~ SOLN
18.0000 [IU] | Freq: Every day | SUBCUTANEOUS | Status: DC
  Filled 2020-09-24: qty 0.18

## 2020-09-24 MED ORDER — INSULIN GLARGINE 100 UNIT/ML ~~LOC~~ SOLN
4.0000 [IU] | Freq: Once | SUBCUTANEOUS | Status: AC
  Administered 2020-09-24: 4 [IU] via SUBCUTANEOUS
  Filled 2020-09-24: qty 0.04

## 2020-09-24 NOTE — Progress Notes (Signed)
PROGRESS NOTE    Joel Holloway  XNA:[id number] DOB: [birth year] DOA: 09/07/2020 PCP: [name], DO   Brief Narrative:   Joel Holloway is a 86 y.o. male with medical history significant of CVA; prostate CA; CAD; HTN; DM; and afib on AC presenting with worsening COVID symptoms.   Patient tested positive for COVID approximately 8 days ago with worsening symptoms of respiratory distress with exertion, dyspnea at rest, sore throat, rhinorrhea, cough congestion and generalized fatigue.  He also notes extremely poor p.o. intake over the past 5 to 6 days.  In the ED patient was noted to be 86% on room air at rest requiring upwards of 3 to 4 L nasal cannula to maintain sats above 90%.  It does not appear he had any outpatient treatment for COVID-19 pneumonia prior to admission.  Subjective:  No significant events overnight, he still reports some dyspnea, cough, he denies any fever, chills or chest pain.     Assessment & Plan:   Principal Problem:   COVID-19 Active Problems:   Stroke [facility])   Prostate cancer ([place])   Hypertension   Diabetes mellitus without complication ([place])   Atrial fibrillation ([place])   Acute respiratory failure with hypoxia due to COVID-19 PNA, POA Cannot rule out concurrent CAP, POA SpO2: 90 % O2 Flow Rate (L/min): 9 L/min - CXR personally reviewed, bibasilar atelectasis versus infiltrate consistent with community-acquired pneumonia > COVID - Treated  Azith/ceftriaixone  for potential underlying CAP given procalcitonin >0.5.   - Continue steroids and Remdesivir per protocol, increase Solu-Medrol to 60 mg every 8 hours. - Prone, IS/flutter, early ambulation as tolerated - PT/OT consults -He remains on 9 L nasal cannula today, encouraged with incentive spirometer and flutter valve -treated with Actemra 6/2 -Continue to trend inflammatory markers, CRP and D-dimer significantly elevated, but trending down, will continue to monitor closely.  SpO2: 90 % O2 Flow  Rate (L/min): 9 L/min   DM, well-controlled non-insulin-dependent Lab Results  Component Value Date   HGBA1C 5.7 (H) 08/31/2020  -CBGs significantly uncontrolled most likely in the setting of IV steroids, will increase Lantus to 18 units, will increase before meal NovoLog to 6 units, continue with insulin sliding scale.   ontinue sliding scale insulin, hypoglycemic protocol - Hold Glucotrol  HTN - Continue Toprol XL  HLD - Continue Zocor  Afib - Rate control with Toprol - Continue Xarelto -discussed possibility of transition to Eliquis in the near future given CKD 3B  Stage 3b CKD -Appears to be similar to prior ER visit on 3/12 -Needs ongoing monitoring  Obesity -BMI is 32.5 -Weight loss should be encouraged -Outpatient PCP/bariatric medicine f/u encouraged   DVT prophylaxis:  Lovenox  Code Status:  DNR - confirmed with patient Family Communication:  discussion with daughter stacy  over the phone daily  Status is: Inpatient  Dispo: The patient is from: Home (PT/HH/Caretaker)              Anticipated d/c is to:SNF              Anticipated d/c date is: >72h              Patient currently not medically stable for discharge  Consultants:   None  Procedures:   None  Antimicrobials:  Azithromycin, ceftriaxone x5 days, tentative stop date 09/24/2020    Objective: Vitals:   09/24/20 0200 09/24/20 0300 09/24/20 0400 09/24/20 0651  BP:    (!) 172/76  Pulse: (!) 56 (!) 59 (!) 53 73  Resp:    20  Temp:    97.8 F (36.6 C)  TempSrc:    Axillary  SpO2: (!) 88% (!) 89% 92% 90%  Weight:      Height:        Intake/Output Summary (Last 24 hours) at 09/24/2020 1640 Last data filed at 09/24/2020 1432 Gross per 24 hour  Intake 1803.57 ml  Output --  Net 1803.57 ml   Filed Weights   08/28/2020 1500  Weight: 97.1 kg    Examination:   Awake Alert, Oriented X 3, No new F.N deficits, Normal affect,frail Symmetrical Chest wall movement, Good air movement  bilaterally, scattered Rales RRR,No Gallops,Rubs or new Murmurs, No Parasternal Heave +ve B.Sounds, Abd Soft, No tenderness, No rebound - guarding or rigidity. No Cyanosis, Clubbing or edema, No new Rash or bruise      Data Reviewed: I have personally reviewed following labs and imaging studies  CBC: Recent Labs  Lab 09/06/2020 0909 09/21/20 0357 09/22/20 0028 09/23/20 0136 09/24/20 0350  WBC 10.7* 7.5 10.5 10.3 8.3  NEUTROABS 9.3* 6.8 10.0* 9.8* 8.1*  HGB 12.1* 11.5* 11.6* 11.4* 11.5*  HCT 38.3* 36.3* 37.0* 36.2* 37.0*  MCV 86.3 86.4 86.7 87.0 86.7  PLT 603* 570* 641* 674* 001*   Basic Metabolic Panel: Recent Labs  Lab 08/25/2020 0909 09/21/20 0357 09/22/20 0028 09/22/20 1339 09/23/20 0136 09/24/20 0350  NA 135 136 136  --  137 139  K 3.8 4.0 3.9  --  4.0 4.3  CL 103 101 101  --  105 109  CO2 19* 21* 21*  --  21* 20*  GLUCOSE 158* 277* 223* 416* 248* 303*  BUN 47* 53* 63*  --  65* 63*  CREATININE 2.20* 2.28* 2.20*  --  2.14* 2.07*  CALCIUM 8.3* 8.1* 8.5*  --  8.3* 8.0*  MG  --  2.2  --   --   --   --   PHOS  --  4.2  --   --   --   --    GFR: Estimated Creatinine Clearance: 30.5 mL/min (A) (by C-G formula based on SCr of 2.07 mg/dL (H)). Liver Function Tests: Recent Labs  Lab 09/15/2020 0909 09/21/20 0357 09/22/20 0028 09/23/20 0136 09/24/20 0350  AST 35 42* 37 23 25  ALT 23 30 27 24 26   ALKPHOS 65 64 71 73 78  BILITOT 1.2 0.6 0.4 0.5 0.3  PROT 6.9 6.5 6.6 6.1* 6.2*  ALBUMIN 2.8* 2.6* 2.6* 2.5* 2.6*   No results for input(s): LIPASE, AMYLASE in the last 168 hours. No results for input(s): AMMONIA in the last 168 hours. Coagulation Profile: Recent Labs  Lab 09/21/2020 0909  INR 1.5*   Cardiac Enzymes: No results for input(s): CKTOTAL, CKMB, CKMBINDEX, TROPONINI in the last 168 hours. BNP (last 3 results) No results for input(s): PROBNP in the last 8760 hours. HbA1C: No results for input(s): HGBA1C in the last 72 hours. CBG: Recent Labs  Lab  09/23/20 1732 09/23/20 2029 09/24/20 0825 09/24/20 1158 09/24/20 1630  GLUCAP 293* 326* 319* 297* 307*   Lipid Profile: No results for input(s): CHOL, HDL, LDLCALC, TRIG, CHOLHDL, LDLDIRECT in the last 72 hours. Thyroid Function Tests: No results for input(s): TSH, T4TOTAL, FREET4, T3FREE, THYROIDAB in the last 72 hours. Anemia Panel: Recent Labs    09/23/20 0136 09/24/20 0350  FERRITIN 708* 677*   Sepsis Labs: Recent Labs  Lab 09/06/2020 0909 09/18/2020 1138 09/23/20 0136  PROCALCITON 0.72  --  0.29  LATICACIDVEN 1.6 1.5  --     Recent Results (from the past 240 hour(s))  Blood Culture (routine x 2)     Status: None (Preliminary result)   Collection Time: 09/16/2020  9:05 AM   Specimen: BLOOD  Result Value Ref Range Status   Specimen Description BLOOD BLOOD LEFT WRIST  Final   Special Requests   Final    BOTTLES DRAWN AEROBIC AND ANAEROBIC Blood Culture results may not be optimal due to an excessive volume of blood received in culture bottles   Culture   Final    NO GROWTH 4 DAYS Performed at [facility], [place] [address] [place], [place] [address]    Report Status PENDING  Incomplete  Blood Culture (routine x 2)     Status: None (Preliminary result)   Collection Time: 08/22/2020  9:10 AM   Specimen: BLOOD  Result Value Ref Range Status   Specimen Description BLOOD BLOOD RIGHT HAND  Final   Special Requests   Final    BOTTLES DRAWN AEROBIC AND ANAEROBIC Blood Culture adequate volume   Culture   Final    NO GROWTH 4 DAYS Performed at [facility], [place] [address], [place], [place] [address]    Report Status PENDING  Incomplete         Radiology Studies: No results found.      Scheduled Meds: . alfuzosin  10 mg Oral q morning  . vitamin C  500 mg Oral Daily  . dicyclomine  20 mg Oral BID  . docusate sodium  100 mg Oral BID  . FLUoxetine  10 mg Oral q morning  . insulin aspart  0-15 Units Subcutaneous TID WC  . insulin aspart  0-5 Units  Subcutaneous QHS  . insulin aspart  4 Units Subcutaneous TID WC  . insulin glargine  14 Units Subcutaneous Daily  . levETIRAcetam  500 mg Oral q morning  . levothyroxine  25 mcg Oral Q0600  . methylPREDNISolone (SOLU-MEDROL) injection  60 mg Intravenous Q8H   Followed by  . [START ON 09/30/2020] predniSONE  50 mg Oral Daily  . metoprolol succinate  100 mg Oral q morning  . Rivaroxaban  15 mg Oral q morning  . simvastatin  10 mg Oral QPM  . sodium chloride flush  3 mL Intravenous Q12H  . sodium chloride flush  3 mL Intravenous Q12H  . zinc sulfate  220 mg Oral Daily   Continuous Infusions: . sodium chloride       LOS: 4 days    Phillips Climes, MD [facility]  If 7PM-7AM, please contact night-coverage [email or web address]  09/24/2020, 4:40 PM

## 2020-09-24 NOTE — Progress Notes (Signed)
Results for ZANDEN, COLVER (MRN [id number]) as of 09/24/2020 11:06  Ref. Range 09/23/2020 08:03 09/23/2020 12:22 09/23/2020 17:32 09/23/2020 20:29 09/24/2020 08:25  Glucose-Capillary Latest Ref Range: 70 - 99 mg/dL 193 (H) 279 (H) 293 (H) 326 (H) 319 (H)  Noted that CBGs have been greater than 180 mg/dl.  Recommend increasing Lantus to 18 units daily, increase Novolog to 6 units TID if blood sugars continue to be elevated.   Harvel Ricks RN BSN CDE Diabetes Coordinator Pager: [phone]  8am-5pm

## 2020-09-24 NOTE — TOC Progression Note (Signed)
Transition of Care [facility]) - Progression Note    Patient Details  Name: Joel Holloway MRN: [id number] Date of Birth: 1934/11/08  Transition of Care [facility]) CM/SW [place], LCSW Phone Number: 09/24/2020, 3:23 PM  Clinical Narrative:    CSW provided SNF bed offers to patient's daughter, Marzetta Board. She selected [facility]. CSW made [facility] aware of preference once medically stable.    Expected Discharge Plan: [place] Barriers to Discharge: Continued Medical Work up  Expected Discharge Plan and Services Expected Discharge Plan: [place] In-house Referral: Clinical Social Work   Post Acute Care Choice: [place] Living arrangements for the past 2 months: [place]                                       Social Determinants of Health (SDOH) Interventions    Readmission Risk Interventions No flowsheet data found.

## 2020-09-24 NOTE — Care Management Important Message (Signed)
Important Message  Patient Details  Name: Joel Holloway MRN: [id number] Date of Birth: 01-Mar-1935   Medicare Important Message Given:  Yes - Important Message mailed due to current National Emergency   Verbal consent obtained due to current National Emergency  Relationship to patient: Self Contact Name: Demon Call Date: 09/24/20  Time: 1255 Phone: [phone] Outcome: Spoke with contact Important Message mailed to: Patient address on file     Delorse Lek 09/24/2020, 1:07 PM

## 2020-09-25 ENCOUNTER — Inpatient Hospital Stay (HOSPITAL_COMMUNITY): Payer: Medicare Other

## 2020-09-25 LAB — COMPREHENSIVE METABOLIC PANEL
ALT: 24 U/L (ref 0–44)
AST: 22 U/L (ref 15–41)
Albumin: 2.5 g/dL — ABNORMAL LOW (ref 3.5–5.0)
Alkaline Phosphatase: 75 U/L (ref 38–126)
Anion gap: 10 (ref 5–15)
BUN: 55 mg/dL — ABNORMAL HIGH (ref 8–23)
CO2: 20 mmol/L — ABNORMAL LOW (ref 22–32)
Calcium: 7.7 mg/dL — ABNORMAL LOW (ref 8.9–10.3)
Chloride: 108 mmol/L (ref 98–111)
Creatinine, Ser: 1.87 mg/dL — ABNORMAL HIGH (ref 0.61–1.24)
GFR, Estimated: 35 mL/min — ABNORMAL LOW (ref >60)
Glucose, Bld: 214 mg/dL — ABNORMAL HIGH (ref 70–99)
Potassium: 4.1 mmol/L (ref 3.5–5.1)
Sodium: 138 mmol/L (ref 135–145)
Total Bilirubin: 0.4 mg/dL (ref 0.3–1.2)
Total Protein: 5.6 g/dL — ABNORMAL LOW (ref 6.5–8.1)

## 2020-09-25 LAB — C-REACTIVE PROTEIN: CRP: 5 mg/dL — ABNORMAL HIGH (ref <1.0)

## 2020-09-25 LAB — CBC WITH DIFFERENTIAL/PLATELET
Abs Immature Granulocytes: 0 10*3/uL (ref 0.00–0.07)
Basophils Absolute: 0 10*3/uL (ref 0.0–0.1)
Basophils Relative: 0 %
Eosinophils Absolute: 0 10*3/uL (ref 0.0–0.5)
Eosinophils Relative: 0 %
HCT: 35.4 % — ABNORMAL LOW (ref 39.0–52.0)
Hemoglobin: 11.3 g/dL — ABNORMAL LOW (ref 13.0–17.0)
Immature Granulocytes: 0 %
Lymphocytes Relative: 6 %
Lymphs Abs: 0.5 10*3/uL — ABNORMAL LOW (ref 0.7–4.0)
MCH: 27.3 pg (ref 26.0–34.0)
MCHC: 31.9 g/dL (ref 30.0–36.0)
MCV: 85.5 fL (ref 80.0–100.0)
Monocytes Absolute: 0.1 10*3/uL (ref 0.1–1.0)
Monocytes Relative: 1 %
Neutro Abs: 7.5 10*3/uL (ref 1.7–7.7)
Neutrophils Relative %: 93 %
Platelets: 586 10*3/uL — ABNORMAL HIGH (ref 150–400)
RBC: 4.14 MIL/uL — ABNORMAL LOW (ref 4.22–5.81)
RDW: 15.9 % — ABNORMAL HIGH (ref 11.5–15.5)
WBC: 8.1 10*3/uL (ref 4.0–10.5)
nRBC: 0 % (ref 0.0–0.2)

## 2020-09-25 LAB — CULTURE, BLOOD (ROUTINE X 2)
Culture: NO GROWTH
Culture: NO GROWTH
Special Requests: ADEQUATE

## 2020-09-25 LAB — GLUCOSE, CAPILLARY
Glucose-Capillary: 290 mg/dL — ABNORMAL HIGH (ref 70–99)
Glucose-Capillary: 317 mg/dL — ABNORMAL HIGH (ref 70–99)
Glucose-Capillary: 151 mg/dL — ABNORMAL HIGH (ref 70–99)
Glucose-Capillary: 123 mg/dL — ABNORMAL HIGH (ref 70–99)

## 2020-09-25 LAB — D-DIMER, QUANTITATIVE: D-Dimer, Quant: 4.88 ug/mL-FEU — ABNORMAL HIGH (ref 0.00–0.50)

## 2020-09-25 LAB — FERRITIN: Ferritin: 566 ng/mL — ABNORMAL HIGH (ref 24–336)

## 2020-09-25 MED ORDER — APIXABAN 2.5 MG PO TABS: 2.5000 mg | ORAL_TABLET | Freq: Two times a day (BID) | ORAL | Status: DC

## 2020-09-25 MED ORDER — HYDRALAZINE HCL 20 MG/ML IJ SOLN: 5.0000 mg | Freq: Four times a day (QID) | INTRAMUSCULAR | Status: DC | PRN

## 2020-09-25 MED ORDER — FUROSEMIDE 10 MG/ML IJ SOLN
20.0000 mg | Freq: Once | INTRAMUSCULAR | Status: AC
  Administered 2020-09-25: 20 mg via INTRAVENOUS
  Filled 2020-09-25: qty 2

## 2020-09-25 MED ORDER — INSULIN GLARGINE 100 UNIT/ML ~~LOC~~ SOLN
22.0000 [IU] | Freq: Every day | SUBCUTANEOUS | Status: DC
  Administered 2020-09-25 – 2020-09-26 (×2): 22 [IU] via SUBCUTANEOUS
  Filled 2020-09-25 (×2): qty 0.22

## 2020-09-25 NOTE — Plan of Care (Signed)
  Problem: Clinical Measurements: Goal: Ability to maintain clinical measurements within normal limits will improve Outcome: Progressing Goal: Will remain free from infection Outcome: Progressing Goal: Diagnostic test results will improve Outcome: Progressing Goal: Respiratory complications will improve Outcome: Progressing   Problem: Activity: Goal: Risk for activity intolerance will decrease Outcome: Progressing   Problem: Nutrition: Goal: Adequate nutrition will be maintained Outcome: Progressing   Problem: Education: Goal: Knowledge of General Education information will improve Description: Including pain rating scale, medication(s)/side effects and non-pharmacologic comfort measures Outcome: Progressing   Problem: Health Behavior/Discharge Planning: Goal: Ability to manage health-related needs will improve Outcome: Progressing   Problem: Clinical Measurements: Goal: Ability to maintain clinical measurements within normal limits will improve Outcome: Progressing Goal: Will remain free from infection Outcome: Progressing Goal: Diagnostic test results will improve Outcome: Progressing Goal: Respiratory complications will improve Outcome: Progressing Goal: Cardiovascular complication will be avoided Outcome: Progressing   Problem: Activity: Goal: Risk for activity intolerance will decrease Outcome: Progressing   Problem: Nutrition: Goal: Adequate nutrition will be maintained Outcome: Progressing   Problem: Coping: Goal: Level of anxiety will decrease Outcome: Progressing   Problem: Elimination: Goal: Will not experience complications related to bowel motility Outcome: Progressing Goal: Will not experience complications related to urinary retention Outcome: Progressing   Problem: Pain Managment: Goal: General experience of comfort will improve Outcome: Progressing   Problem: Safety: Goal: Ability to remain free from injury will improve Outcome:  Progressing   Problem: Skin Integrity: Goal: Risk for impaired skin integrity will decrease Outcome: Progressing

## 2020-09-25 NOTE — Progress Notes (Signed)
Patient placed on Heated High Flow [place] 25L, 100%, sats 93%.  Patient is tolerating well at this time.  RN at bedside.

## 2020-09-25 NOTE — Progress Notes (Addendum)
ANTICOAGULATION CONSULT NOTE - Initial Consult  Pharmacy Consult for xarelto> eliquis Indication: atrial fibrillation  No Known Allergies  Patient Measurements: Height: 5\' 9"  (175.3 cm) Weight: 97.1 kg (214 lb) IBW/kg (Calculated) : 70.7   Vital Signs: Temp: 97.4 F (36.3 C) (06/04 1551) Temp Source: Oral (06/04 1551) BP: 169/80 (06/04 1752) Pulse Rate: 48 (06/04 1752)  Labs: Recent Labs    09/23/20 0136 09/24/20 0350 09/25/20 0052  HGB 11.4* 11.5* 11.3*  HCT 36.2* 37.0* 35.4*  PLT 674* 712* 586*  CREATININE 2.14* 2.07* 1.87*    Estimated Creatinine Clearance: 33.8 mL/min (A) (by C-G formula based on SCr of 1.87 mg/dL (H)).   Medical History: Past Medical History:  Diagnosis Date  . Atrial fibrillation ([place])   . Diabetes mellitus without complication ([place])   . Hypertension   . Myocardial infarction ([place])   . Polio   . Prostate cancer ([place])   . Stroke [facility])     Medications:  Medications Prior to Admission  Medication Sig Dispense Refill Last Dose  . alfuzosin (UROXATRAL) 10 MG 24 hr tablet Take 10 mg by mouth every morning.   09/19/2020 at am  . dicyclomine (BENTYL) 20 MG tablet Take 20 mg by mouth 2 (two) times daily.   09/19/2020 at pm  . FLUoxetine (PROZAC) 10 MG capsule Take 10 mg by mouth every morning.   09/19/2020 at am  . glipiZIDE (GLUCOTROL) 5 MG tablet Take 5 mg by mouth every evening.   09/19/2020 at pm  . levETIRAcetam (KEPPRA) 500 MG tablet Take 500 mg by mouth every morning.   09/19/2020 at am  . levothyroxine (SYNTHROID) 25 MCG tablet Take 25 mcg by mouth every morning.   09/19/2020 at am  . metoprolol succinate (TOPROL-XL) 100 MG 24 hr tablet Take 100 mg by mouth every morning.   09/19/2020 at 7:45-9am  . Rivaroxaban (XARELTO) 15 MG TABS tablet Take 15 mg by mouth every morning.   09/19/2020 at 7:45-9am  . simvastatin (ZOCOR) 10 MG tablet Take 10 mg by mouth every evening.   09/19/2020 at pm  . triamcinolone (KENALOG) 0.1 % Apply 1 application  topically 2 (two) times daily as needed (itching/rash).   unknown   Scheduled:  . alfuzosin  10 mg Oral q morning  . vitamin C  500 mg Oral Daily  . dicyclomine  20 mg Oral BID  . docusate sodium  100 mg Oral BID  . FLUoxetine  10 mg Oral q morning  . insulin aspart  0-15 Units Subcutaneous TID WC  . insulin aspart  0-5 Units Subcutaneous QHS  . insulin aspart  6 Units Subcutaneous TID WC  . insulin glargine  22 Units Subcutaneous Daily  . levETIRAcetam  500 mg Oral q morning  . levothyroxine  25 mcg Oral Q0600  . methylPREDNISolone (SOLU-MEDROL) injection  60 mg Intravenous Q8H   Followed by  . [START ON 09/30/2020] predniSONE  50 mg Oral Daily  . metoprolol succinate  100 mg Oral q morning  . Rivaroxaban  15 mg Oral q morning  . simvastatin  10 mg Oral QPM  . sodium chloride flush  3 mL Intravenous Q12H  . sodium chloride flush  3 mL Intravenous Q12H  . zinc sulfate  220 mg Oral Daily    Assessment: 86 yo male with afib on xarelto PTA for anticoagulation. Due to CKD, pharmacy consulted to change anticoagulation to apixaban -xarelto given this morning -hg= 11.3, SCr= 1.8   Goal of Therapy:  Monitor  platelets by anticoagulation protocol: Yes   Plan:  -d/c xarelto -apixaban 2.5mg  po bid (start tomorrow morning) -Will follow patient progress  Hildred Laser, PharmD Clinical Pharmacist **Pharmacist phone directory can now be found on [email or web address] ([email or web address]).  Listed under [place].

## 2020-09-25 NOTE — TOC Progression Note (Signed)
Transition of Care [facility]) - Progression Note    Patient Details  Name: Joel Holloway MRN: [id number] Date of Birth: [birth year]  Transition of Care [facility]) CM/SW [place], [place] Phone Number: 09/25/2020, 2:01 PM  Clinical Narrative:      Patient has SNF bed at [facility]. Will need covid close to being medically ready. CSW will continue to follow and assist with discharge planning needs.   Expected Discharge Plan: [place] Barriers to Discharge: Continued Medical Work up  Expected Discharge Plan and Services Expected Discharge Plan: [place] In-house Referral: Clinical Social Work   Post Acute Care Choice: [place] Living arrangements for the past 2 months: [place]                                       Social Determinants of Health (SDOH) Interventions    Readmission Risk Interventions No flowsheet data found.

## 2020-09-25 NOTE — Progress Notes (Addendum)
PROGRESS NOTE    Joel Holloway  NFA:[id number] DOB: June 27, 1934 DOA: 08/25/2020 PCP: [name], DO   Brief Narrative:   Joel Holloway is a 86 y.o. male with medical history significant of CVA; prostate CA; CAD; HTN; DM; and afib on AC presenting with worsening COVID symptoms.   Patient tested positive for COVID approximately 8 days ago with worsening symptoms of respiratory distress with exertion, dyspnea at rest, sore throat, rhinorrhea, cough congestion and generalized fatigue.  He also notes extremely poor p.o. intake over the past 5 to 6 days.  In the ED patient was noted to be 86% on room air at rest requiring upwards of 3 to 4 L nasal cannula to maintain sats above 90%.  It does not appear he had any outpatient treatment for COVID-19 pneumonia prior to admission.  Subjective:  No significant diet as discussed with staff, patient still reports dyspnea, denies fever, chest pain      Assessment & Plan:   Principal Problem:   COVID-19 Active Problems:   Stroke ([place])   Prostate cancer ([place])   Hypertension   Diabetes mellitus without complication ([place])   Atrial fibrillation ([place])   Acute respiratory failure with hypoxia due to COVID-19 PNA, POA Cannot rule out concurrent CAP, POA SpO2: 90 % O2 Flow Rate (L/min): 9 L/min - CXR personally reviewed, bibasilar atelectasis versus infiltrate consistent with community-acquired pneumonia > COVID - Treated  Azith/ceftriaixone  for potential underlying CAP given procalcitonin >0.5.   - Continue steroids and Remdesivir per protocol, increase Solu-Medrol to 60 mg every 8 hours. - Prone, IS/flutter, early ambulation as tolerated - PT/OT consulted -Patient remains on 9 L nasal cannula, he was again encouraged use incentive spirometry, flutter valve, and out of bed to chair with staff assistance . -Chest x-ray repeated today showing worsening multifocal opacities bilaterally  -treated with Actemra 6/2 -Continue to trend inflammatory  markers, CRP and D-dimer significantly elevated (patient is already on xarelto), will continue to monitor closely. -Significant evidence of volume overload, but I will try low-dose Lasix to see if it assist SpO2: 90 % O2 Flow Rate (L/min): 9 L/min   DM, well-controlled non-insulin-dependent Lab Results  Component Value Date   HGBA1C 5.7 (H) 09/14/2020  -CBGs significantly uncontrolled most likely in the setting of IV steroids, I will increase his Lantus to 22 units will increase before meal NovoLog to 6 units, continue with insulin sliding scale.   ontinue sliding scale insulin, hypoglycemic protocol - Hold Glucotrol  HTN - Continue Toprol XL  HLD - Continue Zocor  Afib - Rate control with Toprol - Continue Xarelto -discussed possibility of transition to Eliquis in the near future given CKD 3B  Stage 3b CKD -Appears to be similar to prior ER visit on 3/12 -Needs ongoing monitoring  Obesity -BMI is 32.5 -Weight loss should be encouraged -Outpatient PCP/bariatric medicine f/u encouraged   DVT prophylaxis:  Lovenox  Code Status:  DNR - confirmed with patient Family Communication: Daughter Marzetta Board was left voicemail 6/4  Status is: Inpatient  Dispo: The patient is from: Home (PT/HH/Caretaker)              Anticipated d/c is to:SNF              Anticipated d/c date is: >72h              Patient currently not medically stable for discharge  Consultants:   None  Procedures:   None  Antimicrobials:  Azithromycin, ceftriaxone x5 days, tentative  stop date 09/24/2020    Objective: Vitals:   09/24/20 0651 09/24/20 1939 09/25/20 0438 09/25/20 1551  BP: (!) 172/76 (!) 153/78 (!) 150/70 (!) 169/80  Pulse: 73 (!) 55 (!) 56   Resp: 20 18 18 19   Temp: 97.8 F (36.6 C) (!) 97.2 F (36.2 C) 97.8 F (36.6 C) (!) 97.4 F (36.3 C)  TempSrc: Axillary Oral Oral Oral  SpO2: 90% 93% 90%   Weight:      Height:       No intake or output data in the 24 hours ending  09/25/20 1554 Filed Weights   09/19/2020 1500  Weight: 97.1 kg    Examination:   Awake Alert, Oriented X 3, No new F.N deficits, Normal affect,frail Symmetrical Chest wall movement, Good air movement bilaterally, scattered Rales RRR,No Gallops,Rubs or new Murmurs, No Parasternal Heave +ve B.Sounds, Abd Soft, No tenderness, No rebound - guarding or rigidity. No Cyanosis, Clubbing or edema, No new Rash or bruise       Data Reviewed: I have personally reviewed following labs and imaging studies  CBC: Recent Labs  Lab 09/21/20 0357 09/22/20 0028 09/23/20 0136 09/24/20 0350 09/25/20 0052  WBC 7.5 10.5 10.3 8.3 8.1  NEUTROABS 6.8 10.0* 9.8* 8.1* 7.5  HGB 11.5* 11.6* 11.4* 11.5* 11.3*  HCT 36.3* 37.0* 36.2* 37.0* 35.4*  MCV 86.4 86.7 87.0 86.7 85.5  PLT 570* 641* 674* 712* 654*   Basic Metabolic Panel: Recent Labs  Lab 09/21/20 0357 09/22/20 0028 09/22/20 1339 09/23/20 0136 09/24/20 0350 09/25/20 0052  NA 136 136  --  137 139 138  K 4.0 3.9  --  4.0 4.3 4.1  CL 101 101  --  105 109 108  CO2 21* 21*  --  21* 20* 20*  GLUCOSE 277* 223* 416* 248* 303* 214*  BUN 53* 63*  --  65* 63* 55*  CREATININE 2.28* 2.20*  --  2.14* 2.07* 1.87*  CALCIUM 8.1* 8.5*  --  8.3* 8.0* 7.7*  MG 2.2  --   --   --   --   --   PHOS 4.2  --   --   --   --   --    GFR: Estimated Creatinine Clearance: 33.8 mL/min (A) (by C-G formula based on SCr of 1.87 mg/dL (H)). Liver Function Tests: Recent Labs  Lab 09/21/20 0357 09/22/20 0028 09/23/20 0136 09/24/20 0350 09/25/20 0052  AST 42* 37 23 25 22   ALT 30 27 24 26 24   ALKPHOS 64 71 73 78 75  BILITOT 0.6 0.4 0.5 0.3 0.4  PROT 6.5 6.6 6.1* 6.2* 5.6*  ALBUMIN 2.6* 2.6* 2.5* 2.6* 2.5*   No results for input(s): LIPASE, AMYLASE in the last 168 hours. No results for input(s): AMMONIA in the last 168 hours. Coagulation Profile: Recent Labs  Lab 09/16/2020 0909  INR 1.5*   Cardiac Enzymes: No results for input(s): CKTOTAL, CKMB, CKMBINDEX,  TROPONINI in the last 168 hours. BNP (last 3 results) No results for input(s): PROBNP in the last 8760 hours. HbA1C: No results for input(s): HGBA1C in the last 72 hours. CBG: Recent Labs  Lab 09/24/20 1158 09/24/20 1630 09/24/20 1938 09/25/20 0805 09/25/20 1154  GLUCAP 297* 307* 290* 290* 317*   Lipid Profile: No results for input(s): CHOL, HDL, LDLCALC, TRIG, CHOLHDL, LDLDIRECT in the last 72 hours. Thyroid Function Tests: No results for input(s): TSH, T4TOTAL, FREET4, T3FREE, THYROIDAB in the last 72 hours. Anemia Panel: Recent Labs    09/24/20 0350  09/25/20 0052  FERRITIN 677* 566*   Sepsis Labs: Recent Labs  Lab 08/22/2020 0909 09/08/2020 1138 09/23/20 0136  PROCALCITON 0.72  --  0.29  LATICACIDVEN 1.6 1.5  --     Recent Results (from the past 240 hour(s))  Blood Culture (routine x 2)     Status: None   Collection Time: 08/23/2020  9:05 AM   Specimen: BLOOD  Result Value Ref Range Status   Specimen Description BLOOD BLOOD LEFT WRIST  Final   Special Requests   Final    BOTTLES DRAWN AEROBIC AND ANAEROBIC Blood Culture results may not be optimal due to an excessive volume of blood received in culture bottles   Culture   Final    NO GROWTH 5 DAYS Performed at [facility], [place] [address] [place], [place] [address]    Report Status 09/25/2020 FINAL  Final  Blood Culture (routine x 2)     Status: None   Collection Time: 09/19/2020  9:10 AM   Specimen: BLOOD  Result Value Ref Range Status   Specimen Description BLOOD BLOOD RIGHT HAND  Final   Special Requests   Final    BOTTLES DRAWN AEROBIC AND ANAEROBIC Blood Culture adequate volume   Culture   Final    NO GROWTH 5 DAYS Performed at [facility], [place] [address] [place], [place] [address]    Report Status 09/25/2020 FINAL  Final         Radiology Studies: DG Chest Port 1 View  Result Date: 09/25/2020 CLINICAL DATA:  [place] / Covid + Evaluate Pneumonia/ pleural effusion Admitted 5/22 with 5  days of sore throat along with mild cough, congestion, rhinorrhea, fatigue. He is vaccinated but tested + for COVID. EXAM: PORTABLE CHEST - 1 VIEW COMPARISON:  09/10/2020 FINDINGS: Relatively low lung volumes. Progressive peripheral poorly marginated alveolar opacities throughout the right lung, and at the left lung base. Heart size within normal limits. Aortic Atherosclerosis (ICD10-170.0). No effusion.  No pneumothorax. Right shoulder DJD. IMPRESSION: Worsening asymmetric airspace opacities, right greater than left. Electronically Signed   By: Lucrezia Europe M.D.   On: 09/25/2020 13:08        Scheduled Meds: . alfuzosin  10 mg Oral q morning  . vitamin C  500 mg Oral Daily  . dicyclomine  20 mg Oral BID  . docusate sodium  100 mg Oral BID  . FLUoxetine  10 mg Oral q morning  . insulin aspart  0-15 Units Subcutaneous TID WC  . insulin aspart  0-5 Units Subcutaneous QHS  . insulin aspart  6 Units Subcutaneous TID WC  . insulin glargine  22 Units Subcutaneous Daily  . levETIRAcetam  500 mg Oral q morning  . levothyroxine  25 mcg Oral Q0600  . methylPREDNISolone (SOLU-MEDROL) injection  60 mg Intravenous Q8H   Followed by  . [START ON 09/30/2020] predniSONE  50 mg Oral Daily  . metoprolol succinate  100 mg Oral q morning  . Rivaroxaban  15 mg Oral q morning  . simvastatin  10 mg Oral QPM  . sodium chloride flush  3 mL Intravenous Q12H  . sodium chloride flush  3 mL Intravenous Q12H  . zinc sulfate  220 mg Oral Daily   Continuous Infusions: . sodium chloride       LOS: 5 days    Phillips Climes, MD [facility]  If 7PM-7AM, please contact night-coverage [email or web address]  09/25/2020, 3:54 PM

## 2020-09-26 DIAGNOSIS — R410 Disorientation, unspecified: Secondary | ICD-10-CM

## 2020-09-26 LAB — BASIC METABOLIC PANEL
Anion gap: 10 (ref 5–15)
BUN: 52 mg/dL — ABNORMAL HIGH (ref 8–23)
CO2: 21 mmol/L — ABNORMAL LOW (ref 22–32)
Calcium: 8 mg/dL — ABNORMAL LOW (ref 8.9–10.3)
Chloride: 109 mmol/L (ref 98–111)
Creatinine, Ser: 1.78 mg/dL — ABNORMAL HIGH (ref 0.61–1.24)
GFR, Estimated: 37 mL/min — ABNORMAL LOW (ref >60)
Glucose, Bld: 122 mg/dL — ABNORMAL HIGH (ref 70–99)
Potassium: 3.8 mmol/L (ref 3.5–5.1)
Sodium: 140 mmol/L (ref 135–145)

## 2020-09-26 LAB — CBC
HCT: 40.1 % (ref 39.0–52.0)
Hemoglobin: 12.7 g/dL — ABNORMAL LOW (ref 13.0–17.0)
MCH: 26.8 pg (ref 26.0–34.0)
MCHC: 31.7 g/dL (ref 30.0–36.0)
MCV: 84.6 fL (ref 80.0–100.0)
Platelets: 837 10*3/uL — ABNORMAL HIGH (ref 150–400)
RBC: 4.74 MIL/uL (ref 4.22–5.81)
RDW: 16.2 % — ABNORMAL HIGH (ref 11.5–15.5)
WBC: 11.7 10*3/uL — ABNORMAL HIGH (ref 4.0–10.5)
nRBC: 0 % (ref 0.0–0.2)

## 2020-09-26 LAB — GLUCOSE, CAPILLARY: Glucose-Capillary: 163 mg/dL — ABNORMAL HIGH (ref 70–99)

## 2020-09-26 MED ORDER — LIP MEDEX EX OINT
1.0000 "application " | TOPICAL_OINTMENT | CUTANEOUS | Status: DC | PRN
  Filled 2020-09-26: qty 7

## 2020-09-26 MED ORDER — DIPHENHYDRAMINE HCL 25 MG PO CAPS
25.0000 mg | ORAL_CAPSULE | Freq: Once | ORAL | Status: AC
  Administered 2020-09-26: 25 mg via ORAL
  Filled 2020-09-26: qty 1

## 2020-09-26 MED ORDER — LORAZEPAM 1 MG PO TABS
1.0000 mg | ORAL_TABLET | ORAL | Status: DC | PRN
  Filled 2020-09-26: qty 1

## 2020-09-26 MED ORDER — GLYCOPYRROLATE 1 MG PO TABS
1.0000 mg | ORAL_TABLET | ORAL | Status: DC | PRN
  Filled 2020-09-26: qty 1

## 2020-09-26 MED ORDER — ENOXAPARIN SODIUM 100 MG/ML IJ SOSY: 100.0000 mg | PREFILLED_SYRINGE | Freq: Two times a day (BID) | INTRAMUSCULAR | Status: DC

## 2020-09-26 MED ORDER — ENOXAPARIN SODIUM 100 MG/ML IJ SOSY: 1.0000 mg/kg | PREFILLED_SYRINGE | Freq: Two times a day (BID) | INTRAMUSCULAR | Status: DC

## 2020-09-26 MED ORDER — LORAZEPAM 2 MG/ML PO CONC: 1.0000 mg | ORAL | Status: DC | PRN

## 2020-09-26 MED ORDER — GLYCOPYRROLATE 0.2 MG/ML IJ SOLN: 0.2000 mg | INTRAMUSCULAR | Status: DC | PRN

## 2020-09-26 MED ORDER — LORAZEPAM 2 MG/ML IJ SOLN
1.0000 mg | INTRAMUSCULAR | Status: DC | PRN
  Administered 2020-09-26 – 2020-09-27 (×2): 1 mg via INTRAVENOUS
  Filled 2020-09-26 (×2): qty 1

## 2020-09-26 MED ORDER — MORPHINE SULFATE (PF) 2 MG/ML IV SOLN
2.0000 mg | INTRAVENOUS | Status: DC | PRN
  Administered 2020-09-26 – 2020-09-27 (×5): 2 mg via INTRAVENOUS
  Filled 2020-09-26 (×5): qty 1

## 2020-09-26 MED ORDER — POLYVINYL ALCOHOL 1.4 % OP SOLN
1.0000 [drp] | Freq: Four times a day (QID) | OPHTHALMIC | Status: DC | PRN
  Filled 2020-09-26: qty 15

## 2020-09-26 MED ORDER — HALOPERIDOL LACTATE 5 MG/ML IJ SOLN: 2.5000 mg | Freq: Once | INTRAMUSCULAR | Status: AC

## 2020-09-26 MED ORDER — HALOPERIDOL LACTATE 5 MG/ML IJ SOLN
INTRAMUSCULAR | Status: AC
  Administered 2020-09-26: 2.5 mg via INTRAVENOUS
  Filled 2020-09-26: qty 1

## 2020-09-26 NOTE — Progress Notes (Signed)
Chaplain responded to call from nurse. Patient coming to end of life.  Covid positive.  Two Daughters and granddaughter present bedside. Patient was predeceased by wife and another daughter.  Chaplain offered ministry of presence and prayer.  Patient was devout Catholic but family was pleased with the spiritual care this chaplain offered.  Chaplain offered support to staff as well, who obviously served the family with much heart. Rev. Tamsen Snider Pager [phone]

## 2020-09-26 NOTE — Progress Notes (Signed)
HOSPITAL MEDICINE OVERNIGHT EVENT NOTE     Notified by nursing that patient has a significant oxygen requirement and continues to pull his oxygen off, frequently resulting in bouts of substantial hypoxia.  Patient is wide-awake but requires sleeping aid.  Provided patient with Benadryl 25 mg p.o. x1 to help patient's sleep and hopefully with patient sleeping he will remain more compliant with the supplemental oxygen.  Vernelle Emerald  MD [facility]   ADDENDUM (2AM)  Patient becoming visibly more confused, pulling off oxygen.  Agitated.  Will give 2.5 IV Haldol.    Sherryll Burger Delcenia Inman  ADDENDUM (2:40AM)  Unfortunately Haldol is not successful.  Patient continues to be agitated, placing himself at risk by regularly pulling off oxygen.  Will place in soft wrist restraints for his own safety.  Sherryll Burger Asahd Can

## 2020-09-26 NOTE — Progress Notes (Signed)
PROGRESS NOTE    Joel Holloway  QPR:[id number] DOB: 03-05-1935 DOA: 09/05/2020 PCP: [name], DO   Brief Narrative:   Joel Holloway is a 86 y.o. male with medical history significant of CVA; prostate CA; CAD; HTN; DM; and afib on AC presenting with worsening COVID symptoms.   Patient tested positive for COVID approximately 8 days ago with worsening symptoms of respiratory distress with exertion, dyspnea at rest, sore throat, rhinorrhea, cough congestion and generalized fatigue.  He also notes extremely poor p.o. intake over the past 5 to 6 days.  In the ED patient was noted to be 86% on room air at rest requiring upwards of 3 to 4 L nasal cannula to maintain sats above 90%.  It does not appear he had any outpatient treatment for COVID-19 pneumonia prior to admission.  Subjective:  With an episode of confusion and restlessness overnight, required as needed Haldol's and restraints   Assessment & Plan:   Principal Problem:   COVID-19 Active Problems:   Stroke ([place])   Prostate cancer ([place])   Hypertension   Diabetes mellitus without complication ([place])   Atrial fibrillation ([place])   Acute respiratory failure with hypoxia due to COVID-19 PNA, POA Cannot rule out concurrent CAP, POA Hospital delirium/acute metabolic encephalopathy DM, well-controlled non-insulin-dependent HTN HLD Afib Stage 3b CKD Obesity Elevated D-dimers  Patient was treated with steroids, remdesivir, and he received Actemra as well, he was kept on empiric antibiotic coverage for pneumonia as well given procalcitonin>0.5, despite that he continues to have worsening respiratory status, and increased oxygen requirement, this morning he is on heated high flow 35 L nasal cannula at 100%, with significant amount of respiratory distress and dyspnea, as well he is with acute encephalopathy/hospital delirium where he was trying to pull off his heated high flow, overall prognosis looks very grim especially in the  setting of his multiple comorbidities, and frail status at baseline, discussed with daughters and granddaughter at baseline, and they seem they are very clear about his wishes, and they want to proceed with full comfort measures, which is very appropriate here given very poor prognosis anticipated and the amount of discomfort, some palliative order set has been initiated, as needed morphine and Ativan for dyspnea.   DVT prophylaxis:   And as comfort Code Status:  DNR -comfort Family Communication: Is with both daughters and granddaughter at bedside  Status is: Inpatient  Dispo: The patient is from: Home (PT/HH/Caretaker)              Anticipated d/c is to: hospital death expected              Anticipated d/c date is: Hospital death expected              Patient currently not medically stable for discharge  Consultants:   None  Procedures:   None  Antimicrobials:  Azithromycin, ceftriaxone x5 days, tentative stop date 09/24/2020    Objective: Vitals:   09/25/20 1752 09/25/20 2031 09/26/20 0406 09/26/20 0841  BP: (!) 169/80 (!) 146/82 (!) 156/98 121/83  Pulse: (!) 48 (!) 57 61 81  Resp: 16 18 (!) 21 (!) 22  Temp:  (!) 97.4 F (36.3 C) 97.6 F (36.4 C)   TempSrc:  Oral Axillary   SpO2: 93% 90% 91% 95%  Weight:      Height:        Intake/Output Summary (Last 24 hours) at 09/26/2020 1418 Last data filed at 09/26/2020 0836 Gross per 24 hour  Intake  120 ml  Output --  Net 120 ml   Filed Weights   08/26/2020 1500  Weight: 97.1 kg    Examination:   Patient is lethargic, confused, impaired judgment and insight Tachypneic, significant rales bilaterally with some use of accessory muscles Tachycardic, no rubs or gallops Abdomen soft, nontender, bowel sounds present Extremities with no edema, clubbing or cyanosis    Data Reviewed: I have personally reviewed following labs and imaging studies  CBC: Recent Labs  Lab 09/21/20 0357 09/22/20 0028 09/23/20 0136  09/24/20 0350 09/25/20 0052 09/26/20 0017  WBC 7.5 10.5 10.3 8.3 8.1 11.7*  NEUTROABS 6.8 10.0* 9.8* 8.1* 7.5  --   HGB 11.5* 11.6* 11.4* 11.5* 11.3* 12.7*  HCT 36.3* 37.0* 36.2* 37.0* 35.4* 40.1  MCV 86.4 86.7 87.0 86.7 85.5 84.6  PLT 570* 641* 674* 712* 586* 465*   Basic Metabolic Panel: Recent Labs  Lab 09/21/20 0357 09/22/20 0028 09/22/20 1339 09/23/20 0136 09/24/20 0350 09/25/20 0052 09/26/20 0017  NA 136 136  --  137 139 138 140  K 4.0 3.9  --  4.0 4.3 4.1 3.8  CL 101 101  --  105 109 108 109  CO2 21* 21*  --  21* 20* 20* 21*  GLUCOSE 277* 223* 416* 248* 303* 214* 122*  BUN 53* 63*  --  65* 63* 55* 52*  CREATININE 2.28* 2.20*  --  2.14* 2.07* 1.87* 1.78*  CALCIUM 8.1* 8.5*  --  8.3* 8.0* 7.7* 8.0*  MG 2.2  --   --   --   --   --   --   PHOS 4.2  --   --   --   --   --   --    GFR: Estimated Creatinine Clearance: 35.5 mL/min (A) (by C-G formula based on SCr of 1.78 mg/dL (H)). Liver Function Tests: Recent Labs  Lab 09/21/20 0357 09/22/20 0028 09/23/20 0136 09/24/20 0350 09/25/20 0052  AST 42* 37 23 25 22   ALT 30 27 24 26 24   ALKPHOS 64 71 73 78 75  BILITOT 0.6 0.4 0.5 0.3 0.4  PROT 6.5 6.6 6.1* 6.2* 5.6*  ALBUMIN 2.6* 2.6* 2.5* 2.6* 2.5*   No results for input(s): LIPASE, AMYLASE in the last 168 hours. No results for input(s): AMMONIA in the last 168 hours. Coagulation Profile: Recent Labs  Lab 08/28/2020 0909  INR 1.5*   Cardiac Enzymes: No results for input(s): CKTOTAL, CKMB, CKMBINDEX, TROPONINI in the last 168 hours. BNP (last 3 results) No results for input(s): PROBNP in the last 8760 hours. HbA1C: No results for input(s): HGBA1C in the last 72 hours. CBG: Recent Labs  Lab 09/25/20 0805 09/25/20 1154 09/25/20 1708 09/25/20 2156 09/26/20 0818  GLUCAP 290* 317* 151* 123* 163*   Lipid Profile: No results for input(s): CHOL, HDL, LDLCALC, TRIG, CHOLHDL, LDLDIRECT in the last 72 hours. Thyroid Function Tests: No results for input(s): TSH,  T4TOTAL, FREET4, T3FREE, THYROIDAB in the last 72 hours. Anemia Panel: Recent Labs    09/24/20 0350 09/25/20 0052  FERRITIN 677* 566*   Sepsis Labs: Recent Labs  Lab 09/17/2020 0909 08/31/2020 1138 09/23/20 0136  PROCALCITON 0.72  --  0.29  LATICACIDVEN 1.6 1.5  --     Recent Results (from the past 240 hour(s))  Blood Culture (routine x 2)     Status: None   Collection Time: 09/18/2020  9:05 AM   Specimen: BLOOD  Result Value Ref Range Status   Specimen Description BLOOD BLOOD LEFT  WRIST  Final   Special Requests   Final    BOTTLES DRAWN AEROBIC AND ANAEROBIC Blood Culture results may not be optimal due to an excessive volume of blood received in culture bottles   Culture   Final    NO GROWTH 5 DAYS Performed at [facility], [place] [address], [place], [place] [address]    Report Status 09/25/2020 FINAL  Final  Blood Culture (routine x 2)     Status: None   Collection Time: 09/05/2020  9:10 AM   Specimen: BLOOD  Result Value Ref Range Status   Specimen Description BLOOD BLOOD RIGHT HAND  Final   Special Requests   Final    BOTTLES DRAWN AEROBIC AND ANAEROBIC Blood Culture adequate volume   Culture   Final    NO GROWTH 5 DAYS Performed at [facility], [place] [address] [place], [place] [address]    Report Status 09/25/2020 FINAL  Final         Radiology Studies: DG Chest Port 1 View  Result Date: 09/25/2020 CLINICAL DATA:  [place] / Covid + Evaluate Pneumonia/ pleural effusion Admitted 5/22 with 5 days of sore throat along with mild cough, congestion, rhinorrhea, fatigue. He is vaccinated but tested + for COVID. EXAM: PORTABLE CHEST - 1 VIEW COMPARISON:  08/24/2020 FINDINGS: Relatively low lung volumes. Progressive peripheral poorly marginated alveolar opacities throughout the right lung, and at the left lung base. Heart size within normal limits. Aortic Atherosclerosis (ICD10-170.0). No effusion.  No pneumothorax. Right shoulder DJD. IMPRESSION: Worsening asymmetric  airspace opacities, right greater than left. Electronically Signed   By: Lucrezia Europe M.D.   On: 09/25/2020 13:08        Scheduled Meds:  Continuous Infusions:    LOS: 6 days    Phillips Climes, MD [facility]  If 7PM-7AM, please contact night-coverage [email or web address]  09/26/2020, 2:18 PM

## 2020-09-26 NOTE — Progress Notes (Signed)
ANTICOAGULATION CONSULT NOTE - Initial Consult  Pharmacy Consult for eliquis > enoxaparin Indication: atrial fibrillation  No Known Allergies  Patient Measurements: Height: 5\' 9"  (175.3 cm) Weight: 97.1 kg (214 lb) IBW/kg (Calculated) : 70.7   Vital Signs: Temp: 97.6 F (36.4 C) (06/05 0406) Temp Source: Axillary (06/05 0406) BP: 121/83 (06/05 0841) Pulse Rate: 81 (06/05 0841)  Labs: Recent Labs    09/24/20 0350 09/25/20 0052 09/26/20 0017  HGB 11.5* 11.3* 12.7*  HCT 37.0* 35.4* 40.1  PLT 712* 586* 837*  CREATININE 2.07* 1.87* 1.78*    Estimated Creatinine Clearance: 35.5 mL/min (A) (by C-G formula based on SCr of 1.78 mg/dL (H)).   Assessment: 86 yo male with afib on xarelto PTA for anticoagulation. Due to CKD, pharmacy consulted to change anticoagulation to apixaban. Now pharmacy is consulted to switch DOAC therapy to enoxaparin for anticoagulation.   Last anticoagulant dose given: rivaroxaban 15mg  on 6/4 @0852   CBC: Hgb 12.7, Plt 837   Goal of Therapy:  Monitor platelets by anticoagulation protocol: Yes   Plan:  -d/c apixaban order -Initiate enoxaparin (1mg /kg SQ q12h) 100mg  SQ q12h  -Will follow patient progress -f/u CBC, Scr, s/sx bleeding, long term Anticoag plans   Wilson Singer, PharmD PGY1 Pharmacy Resident 09/26/2020 8:53 AM

## 2020-09-26 NOTE — Plan of Care (Signed)
  Problem: Clinical Measurements: Goal: Ability to maintain clinical measurements within normal limits will improve Outcome: Progressing Goal: Will remain free from infection Outcome: Progressing Goal: Diagnostic test results will improve Outcome: Progressing Goal: Respiratory complications will improve Outcome: Progressing   Problem: Activity: Goal: Risk for activity intolerance will decrease Outcome: Progressing   Problem: Nutrition: Goal: Adequate nutrition will be maintained Outcome: Progressing   Problem: Education: Goal: Knowledge of General Education information will improve Description: Including pain rating scale, medication(s)/side effects and non-pharmacologic comfort measures Outcome: Progressing   Problem: Health Behavior/Discharge Planning: Goal: Ability to manage health-related needs will improve Outcome: Progressing   Problem: Clinical Measurements: Goal: Ability to maintain clinical measurements within normal limits will improve Outcome: Progressing Goal: Will remain free from infection Outcome: Progressing Goal: Diagnostic test results will improve Outcome: Progressing Goal: Respiratory complications will improve Outcome: Progressing Goal: Cardiovascular complication will be avoided Outcome: Progressing   Problem: Activity: Goal: Risk for activity intolerance will decrease Outcome: Progressing   Problem: Nutrition: Goal: Adequate nutrition will be maintained Outcome: Progressing   Problem: Coping: Goal: Level of anxiety will decrease Outcome: Progressing   Problem: Elimination: Goal: Will not experience complications related to bowel motility Outcome: Progressing Goal: Will not experience complications related to urinary retention Outcome: Progressing   Problem: Pain Managment: Goal: General experience of comfort will improve Outcome: Progressing   Problem: Safety: Goal: Ability to remain free from injury will improve Outcome:  Progressing   Problem: Skin Integrity: Goal: Risk for impaired skin integrity will decrease Outcome: Progressing   Problem: Safety: Goal: Non-violent Restraint(s) Outcome: Progressing

## 2020-09-26 NOTE — Progress Notes (Signed)
Have transitioned the patient from 35L high flow [place] to 2L [place]. IV morphine given prior. Patient's two daughters and his granddaughter are at bedside as well as the chaplain. Patient is comfortable right now and does not have any increased work of breathing at the moment. Will continue to provide support and give PRN medications to promote comfort.

## 2020-09-27 MED ORDER — LORAZEPAM 2 MG/ML IJ SOLN
1.0000 mg | INTRAMUSCULAR | Status: DC | PRN
  Administered 2020-09-27 – 2020-09-30 (×6): 1 mg via INTRAVENOUS
  Filled 2020-09-27 (×6): qty 1

## 2020-09-27 MED ORDER — LORAZEPAM 2 MG/ML IJ SOLN
1.0000 mg | INTRAMUSCULAR | Status: DC | PRN
Start: 2020-09-27 — End: 2020-09-27

## 2020-09-27 MED ORDER — LORAZEPAM 2 MG/ML PO CONC: 1.0000 mg | ORAL | Status: DC | PRN

## 2020-09-27 MED ORDER — LORAZEPAM 1 MG PO TABS: 1.0000 mg | ORAL_TABLET | ORAL | Status: DC | PRN

## 2020-09-27 MED ORDER — MORPHINE SULFATE (PF) 2 MG/ML IV SOLN
2.0000 mg | INTRAVENOUS | Status: DC | PRN
  Administered 2020-09-27 – 2020-09-29 (×10): 2 mg via INTRAVENOUS
  Filled 2020-09-27 (×12): qty 1

## 2020-09-27 NOTE — Progress Notes (Signed)
PROGRESS NOTE    Joel Holloway  BJY:[id number] DOB: 1934-11-15 DOA: 08/26/2020 PCP: [name], DO   Brief Narrative:   Joel Holloway is a 86 y.o. male with medical history significant of CVA; prostate CA; CAD; HTN; DM; and afib on AC presenting with worsening COVID symptoms.   Patient tested positive for COVID approximately 8 days ago with worsening symptoms of respiratory distress with exertion, dyspnea at rest, sore throat, rhinorrhea, cough congestion and generalized fatigue.  He also notes extremely poor p.o. intake over the past 5 to 6 days.  In the ED patient was noted to be 86% on room air at rest requiring upwards of 3 to 4 L nasal cannula to maintain sats above 90%.  It does not appear he had any outpatient treatment for COVID-19 pneumonia prior to admission.  Patient was treated with IV remdesivir, steroids, with rapidly increasing oxygen requirement, so he did receive Actemra as well, despite that patient continues to have worsening respiratory status, overall he is extremely weak, frail, and fatigue, he did require up to 35 L 100% with high flow nasal cannula, as well he did develop some delirium, dizziness, overall poor prognosis, with poor baseline and multiple comorbidities, patient has been transitioned to full comfort measures after discussing with family.  Subjective:  No significant events overnight, appears to be comfortable and restful on current regimen.   Assessment & Plan:   Principal Problem:   COVID-19 Active Problems:   Stroke [facility])   Prostate cancer ([place])   Hypertension   Diabetes mellitus without complication ([place])   Atrial fibrillation ([place])   Acute respiratory failure with hypoxia due to COVID-19 PNA, POA Cannot rule out concurrent CAP, POA Hospital delirium/acute metabolic encephalopathy DM, well-controlled non-insulin-dependent HTN HLD Afib Stage 3b CKD Obesity Elevated D-dimers  Patient was treated with steroids, remdesivir, and he  received Actemra as well, he was kept on empiric antibiotic coverage for pneumonia as well given procalcitonin>0.5, despite that he continues to have worsening respiratory status, and increased oxygen requirement, this morning he is on heated high flow 35 L nasal cannula at 100%, with significant amount of respiratory distress and dyspnea, as well he is with acute encephalopathy/hospital delirium where he was trying to pull off his heated high flow, overall prognosis looks very grim especially in the setting of his multiple comorbidities, and frail status at baseline, discussed with daughters and granddaughter at baseline, and they seem they are very clear about his wishes, and they want to proceed with full comfort measures, which is very appropriate here given very poor prognosis anticipated and the amount of discomfort, some palliative order set has been initiated, as needed morphine and Ativan for dyspnea, I did increase his morphine frequency this morning to every hour as needed, and if he still needing frequent dosing likely will transition him to morphine drip.Marland Kitchen   DVT prophylaxis:   And as comfort Code Status:  DNR -comfort Family Communication: None at bedside  Status is: Inpatient  Dispo: The patient is from: Home (PT/HH/Caretaker)              Anticipated d/c is to: hospital death expected              Anticipated d/c date is: Hospital death expected              Patient currently not medically stable for discharge  Consultants:   None  Procedures:   None  Antimicrobials:  Azithromycin, ceftriaxone x5 days, tentative stop date 09/24/2020  Objective: Vitals:   09/26/20 0406 09/26/20 0841 09/26/20 1725 09/26/20 1900  BP: (!) 156/98 121/83 (!) 147/90 134/69  Pulse: 61 81 (!) 59 (!) 55  Resp: (!) 21 (!) 22 12 16   Temp: 97.6 F (36.4 C)  (!) 97.4 F (36.3 C) (!) 97.2 F (36.2 C)  TempSrc: Axillary  Axillary Axillary  SpO2: 91% 95% (!) 79% (!) 78%  Weight:      Height:        No intake or output data in the 24 hours ending 09/27/20 1340 Filed Weights   09/19/2020 1500  Weight: 97.1 kg    Examination:   SHEENT is unresponsive, in no apparent distress Shallow breathing bilaterally Regular rate and rhythm Abdomen soft Extremities with no cyanosis or edema     Data Reviewed: I have personally reviewed following labs and imaging studies  CBC: Recent Labs  Lab 09/21/20 0357 09/22/20 0028 09/23/20 0136 09/24/20 0350 09/25/20 0052 09/26/20 0017  WBC 7.5 10.5 10.3 8.3 8.1 11.7*  NEUTROABS 6.8 10.0* 9.8* 8.1* 7.5  --   HGB 11.5* 11.6* 11.4* 11.5* 11.3* 12.7*  HCT 36.3* 37.0* 36.2* 37.0* 35.4* 40.1  MCV 86.4 86.7 87.0 86.7 85.5 84.6  PLT 570* 641* 674* 712* 586* 163*   Basic Metabolic Panel: Recent Labs  Lab 09/21/20 0357 09/22/20 0028 09/22/20 1339 09/23/20 0136 09/24/20 0350 09/25/20 0052 09/26/20 0017  NA 136 136  --  137 139 138 140  K 4.0 3.9  --  4.0 4.3 4.1 3.8  CL 101 101  --  105 109 108 109  CO2 21* 21*  --  21* 20* 20* 21*  GLUCOSE 277* 223* 416* 248* 303* 214* 122*  BUN 53* 63*  --  65* 63* 55* 52*  CREATININE 2.28* 2.20*  --  2.14* 2.07* 1.87* 1.78*  CALCIUM 8.1* 8.5*  --  8.3* 8.0* 7.7* 8.0*  MG 2.2  --   --   --   --   --   --   PHOS 4.2  --   --   --   --   --   --    GFR: Estimated Creatinine Clearance: 35.5 mL/min (A) (by C-G formula based on SCr of 1.78 mg/dL (H)). Liver Function Tests: Recent Labs  Lab 09/21/20 0357 09/22/20 0028 09/23/20 0136 09/24/20 0350 09/25/20 0052  AST 42* 37 23 25 22   ALT 30 27 24 26 24   ALKPHOS 64 71 73 78 75  BILITOT 0.6 0.4 0.5 0.3 0.4  PROT 6.5 6.6 6.1* 6.2* 5.6*  ALBUMIN 2.6* 2.6* 2.5* 2.6* 2.5*   No results for input(s): LIPASE, AMYLASE in the last 168 hours. No results for input(s): AMMONIA in the last 168 hours. Coagulation Profile: No results for input(s): INR, PROTIME in the last 168 hours. Cardiac Enzymes: No results for input(s): CKTOTAL, CKMB, CKMBINDEX,  TROPONINI in the last 168 hours. BNP (last 3 results) No results for input(s): PROBNP in the last 8760 hours. HbA1C: No results for input(s): HGBA1C in the last 72 hours. CBG: Recent Labs  Lab 09/25/20 0805 09/25/20 1154 09/25/20 1708 09/25/20 2156 09/26/20 0818  GLUCAP 290* 317* 151* 123* 163*   Lipid Profile: No results for input(s): CHOL, HDL, LDLCALC, TRIG, CHOLHDL, LDLDIRECT in the last 72 hours. Thyroid Function Tests: No results for input(s): TSH, T4TOTAL, FREET4, T3FREE, THYROIDAB in the last 72 hours. Anemia Panel: Recent Labs    09/25/20 0052  FERRITIN 566*   Sepsis Labs: Recent Labs  Lab 09/23/20  0136  PROCALCITON 0.29    Recent Results (from the past 240 hour(s))  Blood Culture (routine x 2)     Status: None   Collection Time: 09/15/2020  9:05 AM   Specimen: BLOOD  Result Value Ref Range Status   Specimen Description BLOOD BLOOD LEFT WRIST  Final   Special Requests   Final    BOTTLES DRAWN AEROBIC AND ANAEROBIC Blood Culture results may not be optimal due to an excessive volume of blood received in culture bottles   Culture   Final    NO GROWTH 5 DAYS Performed at [facility], [place] [address], [place], [place] [address]    Report Status 09/25/2020 FINAL  Final  Blood Culture (routine x 2)     Status: None   Collection Time: 08/26/2020  9:10 AM   Specimen: BLOOD  Result Value Ref Range Status   Specimen Description BLOOD BLOOD RIGHT HAND  Final   Special Requests   Final    BOTTLES DRAWN AEROBIC AND ANAEROBIC Blood Culture adequate volume   Culture   Final    NO GROWTH 5 DAYS Performed at [facility], [place] [address] [place], [place] [address]    Report Status 09/25/2020 FINAL  Final         Radiology Studies: No results found.      Scheduled Meds:  Continuous Infusions:    LOS: 7 days    Phillips Climes, MD [facility]  If 7PM-7AM, please contact night-coverage [email or web address]  09/27/2020, 1:40 PM

## 2020-09-28 NOTE — Progress Notes (Signed)
PROGRESS NOTE    Joel Holloway  KCL:[id number] DOB: 1934/07/30 DOA: 09/15/2020 PCP: [name], DO   Brief Narrative:   Joel Holloway is a 86 y.o. male with medical history significant of CVA; prostate CA; CAD; HTN; DM; and afib on AC presenting with worsening COVID symptoms.   Patient tested positive for COVID approximately 8 days ago with worsening symptoms of respiratory distress with exertion, dyspnea at rest, sore throat, rhinorrhea, cough congestion and generalized fatigue.  He also notes extremely poor p.o. intake over the past 5 to 6 days.  In the ED patient was noted to be 86% on room air at rest requiring upwards of 3 to 4 L nasal cannula to maintain sats above 90%.  It does not appear he had any outpatient treatment for COVID-19 pneumonia prior to admission.  Patient was treated with IV remdesivir, steroids, with rapidly increasing oxygen requirement, so he did receive Actemra as well, despite that patient continues to have worsening respiratory status, overall he is extremely weak, frail, and fatigue, he did require up to 35 L 100% with high flow nasal cannula, as well he did develop some delirium, dizziness, overall poor prognosis, with poor baseline and multiple comorbidities, patient has been transitioned to full comfort measures after discussing with family.  Subjective:  No significant events overnight, appears to be comfortable and restful on current regimen.   Assessment & Plan:   Principal Problem:   COVID-19 Active Problems:   Stroke [facility])   Prostate cancer ([place])   Hypertension   Diabetes mellitus without complication ([place])   Atrial fibrillation ([place])   Acute respiratory failure with hypoxia due to COVID-19 PNA, POA Cannot rule out concurrent CAP, POA Hospital delirium/acute metabolic encephalopathy DM, well-controlled non-insulin-dependent HTN HLD Afib Stage 3b CKD Obesity Elevated D-dimers  Patient was treated with steroids, remdesivir, and he  received Actemra as well, he was kept on empiric antibiotic coverage for pneumonia as well given procalcitonin>0.5, despite that he continues to have worsening respiratory status, and increased oxygen requirement, this morning he is on heated high flow 35 L nasal cannula at 100%, with significant amount of respiratory distress and dyspnea, as well he is with acute encephalopathy/hospital delirium where he was trying to pull off his heated high flow, overall prognosis looks very grim especially in the setting of his multiple comorbidities, and frail status at baseline, discussed with daughters and granddaughter at baseline, and they seem they are very clear about his wishes, and they want to proceed with full comfort measures, which is very appropriate here given very poor prognosis anticipated and the amount of discomfort, some palliative order set has been initiated, as needed morphine and Ativan for dyspnea.  DVT prophylaxis:  None  as comfort Code Status:  DNR -comfort Family Communication: None at bedside  Status is: Inpatient  Dispo: The patient is from: Home (PT/HH/Caretaker)              Anticipated d/c is to: hospital death expected              Anticipated d/c date is: Hospital death expected              Patient currently not medically stable for discharge  Consultants:   None  Procedures:   None  Antimicrobials:  Azithromycin, ceftriaxone x5 days, tentative stop date 09/24/2020    Objective: Vitals:   09/26/20 1900 09/28/20 0526 09/28/20 0527 09/28/20 0528  BP: 134/69 (!) 160/99    Pulse: (!) 55 75 79  73  Resp: 16     Temp: (!) 97.2 F (36.2 C)     TempSrc: Axillary     SpO2: (!) 78% (!) 77% (!) 78% (!) 79%  Weight:      Height:        Intake/Output Summary (Last 24 hours) at 09/28/2020 1613 Last data filed at 09/28/2020 1440 Gross per 24 hour  Intake --  Output 1800 ml  Net -1800 ml   Filed Weights   09/10/2020 1500  Weight: 97.1 kg    Examination:   In bed,  unresponsive, appears comfortable, with shallow breathing, open eyes to verbal stimuli intermittently    Data Reviewed: I have personally reviewed following labs and imaging studies  CBC: Recent Labs  Lab 09/22/20 0028 09/23/20 0136 09/24/20 0350 09/25/20 0052 09/26/20 0017  WBC 10.5 10.3 8.3 8.1 11.7*  NEUTROABS 10.0* 9.8* 8.1* 7.5  --   HGB 11.6* 11.4* 11.5* 11.3* 12.7*  HCT 37.0* 36.2* 37.0* 35.4* 40.1  MCV 86.7 87.0 86.7 85.5 84.6  PLT 641* 674* 712* 586* 748*   Basic Metabolic Panel: Recent Labs  Lab 09/22/20 0028 09/22/20 1339 09/23/20 0136 09/24/20 0350 09/25/20 0052 09/26/20 0017  NA 136  --  137 139 138 140  K 3.9  --  4.0 4.3 4.1 3.8  CL 101  --  105 109 108 109  CO2 21*  --  21* 20* 20* 21*  GLUCOSE 223* 416* 248* 303* 214* 122*  BUN 63*  --  65* 63* 55* 52*  CREATININE 2.20*  --  2.14* 2.07* 1.87* 1.78*  CALCIUM 8.5*  --  8.3* 8.0* 7.7* 8.0*   GFR: Estimated Creatinine Clearance: 35.5 mL/min (A) (by C-G formula based on SCr of 1.78 mg/dL (H)). Liver Function Tests: Recent Labs  Lab 09/22/20 0028 09/23/20 0136 09/24/20 0350 09/25/20 0052  AST 37 23 25 22   ALT 27 24 26 24   ALKPHOS 71 73 78 75  BILITOT 0.4 0.5 0.3 0.4  PROT 6.6 6.1* 6.2* 5.6*  ALBUMIN 2.6* 2.5* 2.6* 2.5*   No results for input(s): LIPASE, AMYLASE in the last 168 hours. No results for input(s): AMMONIA in the last 168 hours. Coagulation Profile: No results for input(s): INR, PROTIME in the last 168 hours. Cardiac Enzymes: No results for input(s): CKTOTAL, CKMB, CKMBINDEX, TROPONINI in the last 168 hours. BNP (last 3 results) No results for input(s): PROBNP in the last 8760 hours. HbA1C: No results for input(s): HGBA1C in the last 72 hours. CBG: Recent Labs  Lab 09/25/20 0805 09/25/20 1154 09/25/20 1708 09/25/20 2156 09/26/20 0818  GLUCAP 290* 317* 151* 123* 163*   Lipid Profile: No results for input(s): CHOL, HDL, LDLCALC, TRIG, CHOLHDL, LDLDIRECT in the last 72  hours. Thyroid Function Tests: No results for input(s): TSH, T4TOTAL, FREET4, T3FREE, THYROIDAB in the last 72 hours. Anemia Panel: No results for input(s): VITAMINB12, FOLATE, FERRITIN, TIBC, IRON, RETICCTPCT in the last 72 hours. Sepsis Labs: Recent Labs  Lab 09/23/20 0136  PROCALCITON 0.29    Recent Results (from the past 240 hour(s))  Blood Culture (routine x 2)     Status: None   Collection Time: 09/01/2020  9:05 AM   Specimen: BLOOD  Result Value Ref Range Status   Specimen Description BLOOD BLOOD LEFT WRIST  Final   Special Requests   Final    BOTTLES DRAWN AEROBIC AND ANAEROBIC Blood Culture results may not be optimal due to an excessive volume of blood received in culture bottles  Culture   Final    NO GROWTH 5 DAYS Performed at [facility], [place] [address] [place], [place] [address]    Report Status 09/25/2020 FINAL  Final  Blood Culture (routine x 2)     Status: None   Collection Time: 08/23/2020  9:10 AM   Specimen: BLOOD  Result Value Ref Range Status   Specimen Description BLOOD BLOOD RIGHT HAND  Final   Special Requests   Final    BOTTLES DRAWN AEROBIC AND ANAEROBIC Blood Culture adequate volume   Culture   Final    NO GROWTH 5 DAYS Performed at [facility], [place] [address], [place], [place] [address]    Report Status 09/25/2020 FINAL  Final         Radiology Studies: No results found.      Scheduled Meds:  Continuous Infusions:    LOS: 8 days    Phillips Climes, MD [facility]  If 7PM-7AM, please contact night-coverage [email or web address]  09/28/2020, 4:13 PM

## 2020-09-28 NOTE — TOC Progression Note (Signed)
Transition of Care [facility]) - Progression Note    Patient Details  Name: Joel Holloway MRN: [id number] Date of Birth: 09/21/1934  Transition of Care [facility]) CM/SW [place], LCSW Phone Number: 09/28/2020, 11:58 AM  Clinical Narrative:    CSW received request from MD to refer patient to hospice facility that accepts COVID patients. CSW spoke with patient's daughter about [facility] or [facility]. She stated they were both too far and the family wishes for patient to remain in the hospital. CSW did check with [facility] and they do not have any COVID beds available today.    Expected Discharge Plan: [place] Barriers to Discharge: Continued Medical Work up  Expected Discharge Plan and Services Expected Discharge Plan: [place] In-house Referral: Clinical Social Work   Post Acute Care Choice: [place] Living arrangements for the past 2 months: [place]                                       Social Determinants of Health (SDOH) Interventions    Readmission Risk Interventions No flowsheet data found.

## 2020-09-29 MED ORDER — MORPHINE SULFATE (CONCENTRATE) 10 MG/0.5ML PO SOLN
5.0000 mg | Freq: Four times a day (QID) | ORAL | Status: DC
  Administered 2020-09-29 – 2020-09-30 (×2): 5 mg via ORAL
  Filled 2020-09-29 (×2): qty 0.5

## 2020-09-29 NOTE — Plan of Care (Signed)
  Problem: Clinical Measurements: Goal: Ability to maintain clinical measurements within normal limits will improve Outcome: Progressing Goal: Will remain free from infection Outcome: Progressing Goal: Diagnostic test results will improve Outcome: Progressing   Problem: Activity: Goal: Risk for activity intolerance will decrease Outcome: Progressing   Problem: Coping: Goal: Level of anxiety will decrease Outcome: Progressing   

## 2020-09-29 NOTE — Progress Notes (Signed)
Nutrition Brief Note  Chart reviewed. Pt now transitioning to comfort care.  No further nutrition interventions planned at this time.  Please re-consult as needed.   Brahm Barbeau W, RD, LDN, CDCES Registered Dietitian II Certified Diabetes Care and Education Specialist Please refer to AMION for RD and/or RD on-call/weekend/after hours pager   

## 2020-09-29 NOTE — Progress Notes (Signed)
PROGRESS NOTE    Joel Holloway  WUJ:[id number] DOB: 1934-10-24 DOA: 08/31/2020 PCP: [name], DO   Brief Narrative:  Joel Holloway is a 86 y.o. male with medical history significant of CVA; prostate CA; CAD; HTN; DM; and afib on anticoagulation-presented with worsening shortness of breath-he was found to have acute hypoxic respiratory failure due to COVID-19 pneumonia.  Unfortunately post admission-his respiratory status continued to worsen with worsening hypoxemia in spite of maximal medical treatment with steroids/Actemra/remdesivir.  After extensive discussion with family-patient was transitioned to full comfort measures.    Subjective: Likely restless but otherwise appears comfortable.   Assessment & Plan: Acute respiratory failure with hypoxia due to COVID-19 PNA, POA Cannot rule out concurrent CAP, POA Hospital delirium/acute metabolic encephalopathy DM, well-controlled non-insulin-dependent HTN HLD Afib Stage 3b CKD Obesity Elevated D-dimers  Prior MD-Dr. Waldron Labs discussed with patient's family-given very poor overall prognosis patient was  subsequently was transitioned to full comfort measures.  He appears slightly restless-we will start him on scheduled oral Roxanol-continue prn IV morphine/IV Ativan-we will reassess on 6/9-and see if he needs to be transition to a morphine infusion.    DVT prophylaxis:  None  as comfort Code Status:  DNR -comfort Family Communication: None at bedside  Status is: Inpatient  Dispo: The patient is from: Home (PT/HH/Caretaker)              Anticipated d/c is to: hospital death expected              Anticipated d/c date is: Hospital death expected              Patient currently not medically stable for discharge  Consultants:   None  Procedures:   None  Antimicrobials:  Azithromycin, ceftriaxone x5 days, tentative stop date 09/24/2020    Objective: Vitals:   09/28/20 0526 09/28/20 0527 09/28/20 0528 09/29/20 1448   BP: (!) 160/99   (!) 145/83  Pulse: 75 79 73 98  Resp:    12  Temp:    (!) 97.5 F (36.4 C)  TempSrc:    Oral  SpO2: (!) 77% (!) 78% (!) 79% (!) 77%  Weight:      Height:        Intake/Output Summary (Last 24 hours) at 09/29/2020 1551 Last data filed at 09/29/2020 0400 Gross per 24 hour  Intake --  Output 800 ml  Net -800 ml   Filed Weights   09/14/2020 1500  Weight: 97.1 kg    Examination:  Gen Exam: Opens eyes-slightly restless.   Data Reviewed: I have personally reviewed following labs and imaging studies  CBC: Recent Labs  Lab 09/23/20 0136 09/24/20 0350 09/25/20 0052 09/26/20 0017  WBC 10.3 8.3 8.1 11.7*  NEUTROABS 9.8* 8.1* 7.5  --   HGB 11.4* 11.5* 11.3* 12.7*  HCT 36.2* 37.0* 35.4* 40.1  MCV 87.0 86.7 85.5 84.6  PLT 674* 712* 586* 956*   Basic Metabolic Panel: Recent Labs  Lab 09/23/20 0136 09/24/20 0350 09/25/20 0052 09/26/20 0017  NA 137 139 138 140  K 4.0 4.3 4.1 3.8  CL 105 109 108 109  CO2 21* 20* 20* 21*  GLUCOSE 248* 303* 214* 122*  BUN 65* 63* 55* 52*  CREATININE 2.14* 2.07* 1.87* 1.78*  CALCIUM 8.3* 8.0* 7.7* 8.0*   GFR: Estimated Creatinine Clearance: 35.5 mL/min (A) (by C-G formula based on SCr of 1.78 mg/dL (H)). Liver Function Tests: Recent Labs  Lab 09/23/20 0136 09/24/20 0350 09/25/20 0052  AST 23 25  22  ALT 24 26 24   ALKPHOS 73 78 75  BILITOT 0.5 0.3 0.4  PROT 6.1* 6.2* 5.6*  ALBUMIN 2.5* 2.6* 2.5*   No results for input(s): LIPASE, AMYLASE in the last 168 hours. No results for input(s): AMMONIA in the last 168 hours. Coagulation Profile: No results for input(s): INR, PROTIME in the last 168 hours. Cardiac Enzymes: No results for input(s): CKTOTAL, CKMB, CKMBINDEX, TROPONINI in the last 168 hours. BNP (last 3 results) No results for input(s): PROBNP in the last 8760 hours. HbA1C: No results for input(s): HGBA1C in the last 72 hours. CBG: Recent Labs  Lab 09/25/20 0805 09/25/20 1154 09/25/20 1708  09/25/20 2156 09/26/20 0818  GLUCAP 290* 317* 151* 123* 163*   Lipid Profile: No results for input(s): CHOL, HDL, LDLCALC, TRIG, CHOLHDL, LDLDIRECT in the last 72 hours. Thyroid Function Tests: No results for input(s): TSH, T4TOTAL, FREET4, T3FREE, THYROIDAB in the last 72 hours. Anemia Panel: No results for input(s): VITAMINB12, FOLATE, FERRITIN, TIBC, IRON, RETICCTPCT in the last 72 hours. Sepsis Labs: Recent Labs  Lab 09/23/20 0136  PROCALCITON 0.29    Recent Results (from the past 240 hour(s))  Blood Culture (routine x 2)     Status: None   Collection Time: 09/21/2020  9:05 AM   Specimen: BLOOD  Result Value Ref Range Status   Specimen Description BLOOD BLOOD LEFT WRIST  Final   Special Requests   Final    BOTTLES DRAWN AEROBIC AND ANAEROBIC Blood Culture results may not be optimal due to an excessive volume of blood received in culture bottles   Culture   Final    NO GROWTH 5 DAYS Performed at [facility], [place] [address] [place], [place] [address]    Report Status 09/25/2020 FINAL  Final  Blood Culture (routine x 2)     Status: None   Collection Time: 09/01/2020  9:10 AM   Specimen: BLOOD  Result Value Ref Range Status   Specimen Description BLOOD BLOOD RIGHT HAND  Final   Special Requests   Final    BOTTLES DRAWN AEROBIC AND ANAEROBIC Blood Culture adequate volume   Culture   Final    NO GROWTH 5 DAYS Performed at [facility], [place] [address] [place], [place] [address]    Report Status 09/25/2020 FINAL  Final         Radiology Studies: No results found.      Scheduled Meds: . morphine CONCENTRATE  5 mg Oral Q6H   Continuous Infusions:    LOS: 9 days    Oren Binet, MD [facility]  If 7PM-7AM, please contact night-coverage [email or web address]  09/29/2020, 3:51 PM

## 2020-09-30 MED ORDER — DIPHENHYDRAMINE HCL 50 MG/ML IJ SOLN
12.5000 mg | INTRAMUSCULAR | Status: DC | PRN
  Administered 2020-09-30: 12.5 mg via INTRAVENOUS
  Filled 2020-09-30: qty 1

## 2020-09-30 MED ORDER — MORPHINE 100MG IN NS 100ML (1MG/ML) PREMIX INFUSION
2.0000 mg/h | INTRAVENOUS | Status: DC
  Administered 2020-09-30: 2 mg/h via INTRAVENOUS
  Filled 2020-09-30: qty 100

## 2020-09-30 MED ORDER — MORPHINE BOLUS VIA INFUSION
2.0000 mg | INTRAVENOUS | Status: DC | PRN
  Filled 2020-09-30: qty 2

## 2020-09-30 MED ORDER — ONDANSETRON 4 MG PO TBDP: 4.0000 mg | ORAL_TABLET | Freq: Four times a day (QID) | ORAL | Status: DC | PRN

## 2020-09-30 MED ORDER — ONDANSETRON HCL 4 MG/2ML IJ SOLN: 4.0000 mg | Freq: Four times a day (QID) | INTRAMUSCULAR | Status: DC | PRN

## 2020-09-30 NOTE — Progress Notes (Signed)
CSW contacted [place] to see if [facility] has any COVID beds available but they do not today.   Gilmore Laroche, MSW, [facility]

## 2020-09-30 NOTE — Progress Notes (Signed)
PROGRESS NOTE    Joel Holloway  GYI:[id number] DOB: 04-01-1935 DOA: 08/28/2020 PCP: [name], DO   Brief Narrative:  Joel Holloway is a 86 y.o. male with medical history significant of CVA; prostate CA; CAD; HTN; DM; and afib on anticoagulation-presented with worsening shortness of breath-he was found to have acute hypoxic respiratory failure due to COVID-19 pneumonia.  Unfortunately post admission-his respiratory status continued to worsen with worsening hypoxemia in spite of maximal medical treatment with steroids/Actemra/remdesivir.  After extensive discussion with family-patient was transitioned to full comfort measures.    Subjective: Likely restless but otherwise appears comfortable.   Assessment & Plan: Acute respiratory failure with hypoxia due to COVID-19 PNA, POA Cannot rule out concurrent CAP, POA Hospital delirium/acute metabolic encephalopathy DM, well-controlled non-insulin-dependent HTN HLD  Afib Stage 3b CKD Obesity Elevated D-dimers  Prior MD-Dr. Waldron Labs discussed with patient's family-given very poor overall prognosis patient was  subsequently was transitioned to full comfort measures.  Daughter at bedside this morning-he continues to be restless-we will start IV morphine infusion.  Awaiting hospice bed-but per social [facility] hospice does not have any COVID-beds.  DVT prophylaxis:  None  as comfort Code Status:  DNR -comfort Family Communication: Daughter at bedside  Status is: Inpatient  Dispo: The patient is from: Home (PT/HH/Caretaker)              Anticipated d/c is to: hospital death expected              Anticipated d/c date is: Hospital death expected              Patient currently not medically stable for discharge  Consultants:  None  Procedures:  None  Antimicrobials:  Azithromycin, ceftriaxone x5 days, tentative stop date 09/24/2020    Objective: Vitals:   09/29/20 1448 09/29/20 2000 09/30/20 0533 09/30/20 0814  BP:  (!) 145/83  (!) 144/93 (!) 152/79  Pulse: 98 94 72 (!) 59  Resp: 12  12 14   Temp: (!) 97.5 F (36.4 C)  (!) 97.4 F (36.3 C) 98 F (36.7 C)  TempSrc: Oral  Axillary Axillary  SpO2: (!) 77%  (!) 78% (!) 82%  Weight:      Height:        Intake/Output Summary (Last 24 hours) at 09/30/2020 1144 Last data filed at 09/30/2020 0950 Gross per 24 hour  Intake 0 ml  Output 1350 ml  Net -1350 ml    Filed Weights   09/01/2020 1500  Weight: 97.1 kg    Examination:  Gen Exam: Opens eyes-slightly restless.   Data Reviewed: I have personally reviewed following labs and imaging studies  CBC: Recent Labs  Lab 09/24/20 0350 09/25/20 0052 09/26/20 0017  WBC 8.3 8.1 11.7*  NEUTROABS 8.1* 7.5  --   HGB 11.5* 11.3* 12.7*  HCT 37.0* 35.4* 40.1  MCV 86.7 85.5 84.6  PLT 712* 586* 837*    Basic Metabolic Panel: Recent Labs  Lab 09/24/20 0350 09/25/20 0052 09/26/20 0017  NA 139 138 140  K 4.3 4.1 3.8  CL 109 108 109  CO2 20* 20* 21*  GLUCOSE 303* 214* 122*  BUN 63* 55* 52*  CREATININE 2.07* 1.87* 1.78*  CALCIUM 8.0* 7.7* 8.0*    GFR: Estimated Creatinine Clearance: 35.5 mL/min (A) (by C-G formula based on SCr of 1.78 mg/dL (H)). Liver Function Tests: Recent Labs  Lab 09/24/20 0350 09/25/20 0052  AST 25 22  ALT 26 24  ALKPHOS 78 75  BILITOT 0.3 0.4  PROT 6.2* 5.6*  ALBUMIN 2.6* 2.5*    No results for input(s): LIPASE, AMYLASE in the last 168 hours. No results for input(s): AMMONIA in the last 168 hours. Coagulation Profile: No results for input(s): INR, PROTIME in the last 168 hours. Cardiac Enzymes: No results for input(s): CKTOTAL, CKMB, CKMBINDEX, TROPONINI in the last 168 hours. BNP (last 3 results) No results for input(s): PROBNP in the last 8760 hours. HbA1C: No results for input(s): HGBA1C in the last 72 hours. CBG: Recent Labs  Lab 09/25/20 0805 09/25/20 1154 09/25/20 1708 09/25/20 2156 09/26/20 0818  GLUCAP 290* 317* 151* 123* 163*    Lipid  Profile: No results for input(s): CHOL, HDL, LDLCALC, TRIG, CHOLHDL, LDLDIRECT in the last 72 hours. Thyroid Function Tests: No results for input(s): TSH, T4TOTAL, FREET4, T3FREE, THYROIDAB in the last 72 hours. Anemia Panel: No results for input(s): VITAMINB12, FOLATE, FERRITIN, TIBC, IRON, RETICCTPCT in the last 72 hours. Sepsis Labs: No results for input(s): PROCALCITON, LATICACIDVEN in the last 168 hours.   No results found for this or any previous visit (from the past 240 hour(s)).        Radiology Studies: No results found.      Scheduled Meds:   Continuous Infusions:  morphine       LOS: 10 days    Oren Binet, MD [facility]  If 7PM-7AM, please contact night-coverage [email or web address]  09/30/2020, 11:44 AM

## 2020-09-30 NOTE — Progress Notes (Deleted)
Report given to [place] at [facility]. Discharge summary updated and will send with patient.

## 2020-10-01 NOTE — Plan of Care (Signed)
  Problem: Clinical Measurements: Goal: Diagnostic test results will improve Outcome: Not Progressing   Problem: Clinical Measurements: Goal: Respiratory complications will improve Outcome: Not Progressing   Problem: Activity: Goal: Risk for activity intolerance will decrease Outcome: Not Progressing

## 2020-10-01 NOTE — Progress Notes (Signed)
PROGRESS NOTE    [name]  RWE:[id number] DOB: April 22, 1935 DOA: 09/09/2020 PCP: [name], DO   Brief Narrative:  Joel Holloway is a 86 y.o. male with medical history significant of CVA; prostate CA; CAD; HTN; DM; and afib on anticoagulation-presented with worsening shortness of breath-he was found to have acute hypoxic respiratory failure due to COVID-19 pneumonia.  Unfortunately post admission-his respiratory status continued to worsen with worsening hypoxemia in spite of maximal medical treatment with steroids/Actemra/remdesivir.  After extensive discussion with family-patient was transitioned to full comfort measures.    Subjective: Comfortable-unresponsive this morning.  Assessment & Plan: Acute respiratory failure with hypoxia due to COVID-19 PNA, POA Cannot rule out concurrent CAP, POA Hospital delirium/acute metabolic encephalopathy DM, well-controlled non-insulin-dependent HTN HLD  Afib Stage 3b CKD Obesity Elevated D-dimers  Prior MD-Dr. Waldron Labs discussed with patient's family-given very poor overall prognosis patient was  subsequently was transitioned to full comfort measures.  Given that he was restless on scheduled oral morphine-he was transitioned to IV morphine on 6/9.  He appears comfortable but actively dying-continue current care.  Suspect inpatient death at this point.    DVT prophylaxis:  None  as comfort Code Status:  DNR -comfort Family Communication: Daughter at bedside on 6/9  Status is: Inpatient  Dispo: The patient is from: Home (PT/HH/Caretaker)              Anticipated d/c is to: hospital death expected              Anticipated d/c date is: Hospital death expected              Patient currently not medically stable for discharge  Consultants:  None  Procedures:  None  Antimicrobials:  Azithromycin, ceftriaxone x5 days, tentative stop date 09/24/2020    Objective: Vitals:   09/30/20 0533 09/30/20 0814 10/01/20 0557 10/01/20  0829  BP: (!) 144/93 (!) 152/79 104/71 (!) 86/55  Pulse: 72 (!) 59 88 99  Resp: 12 14 12 11   Temp: (!) 97.4 F (36.3 C) 98 F (36.7 C) 97.8 F (36.6 C) 97.6 F (36.4 C)  TempSrc: Axillary Axillary Axillary Axillary  SpO2: (!) 78% (!) 82% (!) 82% (!) 83%  Weight:      Height:        Intake/Output Summary (Last 24 hours) at 10/01/2020 1208 Last data filed at 09/30/2020 2200 Gross per 24 hour  Intake 3.39 ml  Output 250 ml  Net -246.61 ml    Filed Weights   09/07/2020 1500  Weight: 97.1 kg    Examination:  Gen Exam: Opens eyes-slightly restless.   Data Reviewed: I have personally reviewed following labs and imaging studies  CBC: Recent Labs  Lab 09/25/20 0052 09/26/20 0017  WBC 8.1 11.7*  NEUTROABS 7.5  --   HGB 11.3* 12.7*  HCT 35.4* 40.1  MCV 85.5 84.6  PLT 586* 837*    Basic Metabolic Panel: Recent Labs  Lab 09/25/20 0052 09/26/20 0017  NA 138 140  K 4.1 3.8  CL 108 109  CO2 20* 21*  GLUCOSE 214* 122*  BUN 55* 52*  CREATININE 1.87* 1.78*  CALCIUM 7.7* 8.0*    GFR: Estimated Creatinine Clearance: 35.5 mL/min (A) (by C-G formula based on SCr of 1.78 mg/dL (H)). Liver Function Tests: Recent Labs  Lab 09/25/20 0052  AST 22  ALT 24  ALKPHOS 75  BILITOT 0.4  PROT 5.6*  ALBUMIN 2.5*    No results for input(s): LIPASE, AMYLASE in the last  168 hours. No results for input(s): AMMONIA in the last 168 hours. Coagulation Profile: No results for input(s): INR, PROTIME in the last 168 hours. Cardiac Enzymes: No results for input(s): CKTOTAL, CKMB, CKMBINDEX, TROPONINI in the last 168 hours. BNP (last 3 results) No results for input(s): PROBNP in the last 8760 hours. HbA1C: No results for input(s): HGBA1C in the last 72 hours. CBG: Recent Labs  Lab 09/25/20 0805 09/25/20 1154 09/25/20 1708 09/25/20 2156 09/26/20 0818  GLUCAP 290* 317* 151* 123* 163*    Lipid Profile: No results for input(s): CHOL, HDL, LDLCALC, TRIG, CHOLHDL, LDLDIRECT in  the last 72 hours. Thyroid Function Tests: No results for input(s): TSH, T4TOTAL, FREET4, T3FREE, THYROIDAB in the last 72 hours. Anemia Panel: No results for input(s): VITAMINB12, FOLATE, FERRITIN, TIBC, IRON, RETICCTPCT in the last 72 hours. Sepsis Labs: No results for input(s): PROCALCITON, LATICACIDVEN in the last 168 hours.   No results found for this or any previous visit (from the past 240 hour(s)).        Radiology Studies: No results found.      Scheduled Meds:   Continuous Infusions:  morphine 2 mg/hr (09/30/20 1258)     LOS: 11 days    Oren Binet, MD [facility]  If 7PM-7AM, please contact night-coverage [email or web address]  10/01/2020, 12:08 PM

## 2020-10-22 NOTE — Death Summary Note (Signed)
DEATH SUMMARY   Patient Details  Name: Joel Holloway MRN: [id number] DOB: 1934-06-04  Admission/Discharge Information   Admit Date:  09/25/20  Date of Death: Date of Death: Oct 07, 2020  Time of Death: Time of Death: 0340  Length of Stay: 2022/07/09  Referring Physician: [name], DO   Reason(s) for Hospitalization  Acute hypoxic respiratory failure due to COVID-19 pneumonia  Diagnoses  Preliminary cause of death:  Secondary Diagnoses (including complications and co-morbidities):  Principal Problem:   COVID-19 Active Problems:   Stroke [facility])   Prostate cancer ([place])   Hypertension   Diabetes mellitus without complication ([place])   Atrial fibrillation [facility])   Brief Hospital Course (including significant findings, care, treatment, and services provided and events leading to death)   Brief summary: Joel Holloway is a 86 y.o. male with medical history significant of CVA; prostate CA; CAD; HTN; DM; and afib on anticoagulation-presented with worsening shortness of breath-he was found to have acute hypoxic respiratory failure due to COVID-19 pneumonia.  Unfortunately post admission-his respiratory status continued to worsen with worsening hypoxemia in spite of maximal medical treatment with steroids/Actemra/remdesivir.  After extensive discussion with family-patient was transitioned to full comfort measures.  He subsequently expired on October 07, 2020 at 0340 hours  Hospital course by problem list: Acute hypoxic respiratory failure COVID-19 pneumonia: Had escalating oxygen requirements-in spite of maximal treatment with steroids, Remdesivir.  Was also given Actemra.  He was empirically covered with Rocephin/Zithromax for potential superimposed bacterial pneumonia-although it was felt this was unlikely.  Unfortunately he continued to deteriorate-and was subsequently transitioned to full comfort measures.  CKD stage IIIb: Was at baseline.  PAF: Rate controlled with Toprol-was on anticoagulation  with Xarelto  HTN: BP was controlled with Toprol-XL  HLD: Was maintained on statin  Morbid obesity  Pertinent Labs and Studies  Significant Diagnostic Studies DG Chest Port 1 View  Result Date: 09/25/2020 CLINICAL DATA:  [facility] / Covid + Evaluate Pneumonia/ pleural effusion Admitted 5/22 with 5 days of sore throat along with mild cough, congestion, rhinorrhea, fatigue. He is vaccinated but tested + for COVID. EXAM: PORTABLE CHEST - 1 VIEW COMPARISON:  09/25/2020 FINDINGS: Relatively low lung volumes. Progressive peripheral poorly marginated alveolar opacities throughout the right lung, and at the left lung base. Heart size within normal limits. Aortic Atherosclerosis (ICD10-170.0). No effusion.  No pneumothorax. Right shoulder DJD. IMPRESSION: Worsening asymmetric airspace opacities, right greater than left. Electronically Signed   By: Lucrezia Europe M.D.   On: 09/25/2020 13:08   DG Chest Port 1 View  Result Date: 09/25/2020 CLINICAL DATA:  Weakness. Worsening symptoms of COVID-19 infection. Hypoxia. EXAM: PORTABLE CHEST 1 VIEW COMPARISON:  None. FINDINGS: Cardiac silhouette is partly obscured due to low lung volumes, but grossly normal in size. No mediastinal or hilar masses. Bilateral lung base opacities, more apparent on the left, consistent with atelectasis, accentuated by low lung volumes. Remainder of the lungs is clear. No convincing pleural effusion and no pneumothorax. Skeletal structures are demineralized but grossly intact. IMPRESSION: 1. Left greater than right lung base opacities most likely atelectasis with pneumonia also in the differential diagnosis. 2. No other evidence of acute cardiopulmonary disease. No pulmonary edema. Electronically Signed   By: Lajean Manes M.D.   On: 2020/09/25 09:21    Microbiology No results found for this or any previous visit (from the past 240 hour(s)).  Lab Basic Metabolic Panel: No results for input(s): NA, K, CL, CO2, GLUCOSE, BUN, CREATININE,  CALCIUM, MG, PHOS in the last 168 hours.  Liver Function Tests: No results for input(s): AST, ALT, ALKPHOS, BILITOT, PROT, ALBUMIN in the last 168 hours. No results for input(s): LIPASE, AMYLASE in the last 168 hours. No results for input(s): AMMONIA in the last 168 hours. CBC: No results for input(s): WBC, NEUTROABS, HGB, HCT, MCV, PLT in the last 168 hours. Cardiac Enzymes: No results for input(s): CKTOTAL, CKMB, CKMBINDEX, TROPONINI in the last 168 hours. Sepsis Labs: No results for input(s): PROCALCITON, WBC, LATICACIDVEN in the last 168 hours.  Procedures/Operations     Joel Holloway 10/04/2020, 2:48 PM

## 2020-10-22 NOTE — Progress Notes (Signed)
Patient expired at 5. Family notified. Post mortem care provided. Patient placement notified and patient taken to morgue.

## 2020-10-22 DEATH — deceased

## 2021-12-29 IMAGING — CR DG TIBIA/FIBULA 2V*R*
2 series · 2 of 2 positions shown · non-contrast
Comparison: None.

CLINICAL DATA: 85-year-old male status post fall last week with
continued pain and swelling.

EXAM:
RIGHT TIBIA AND FIBULA - 2 VIEW

[x tib-fib lat right]
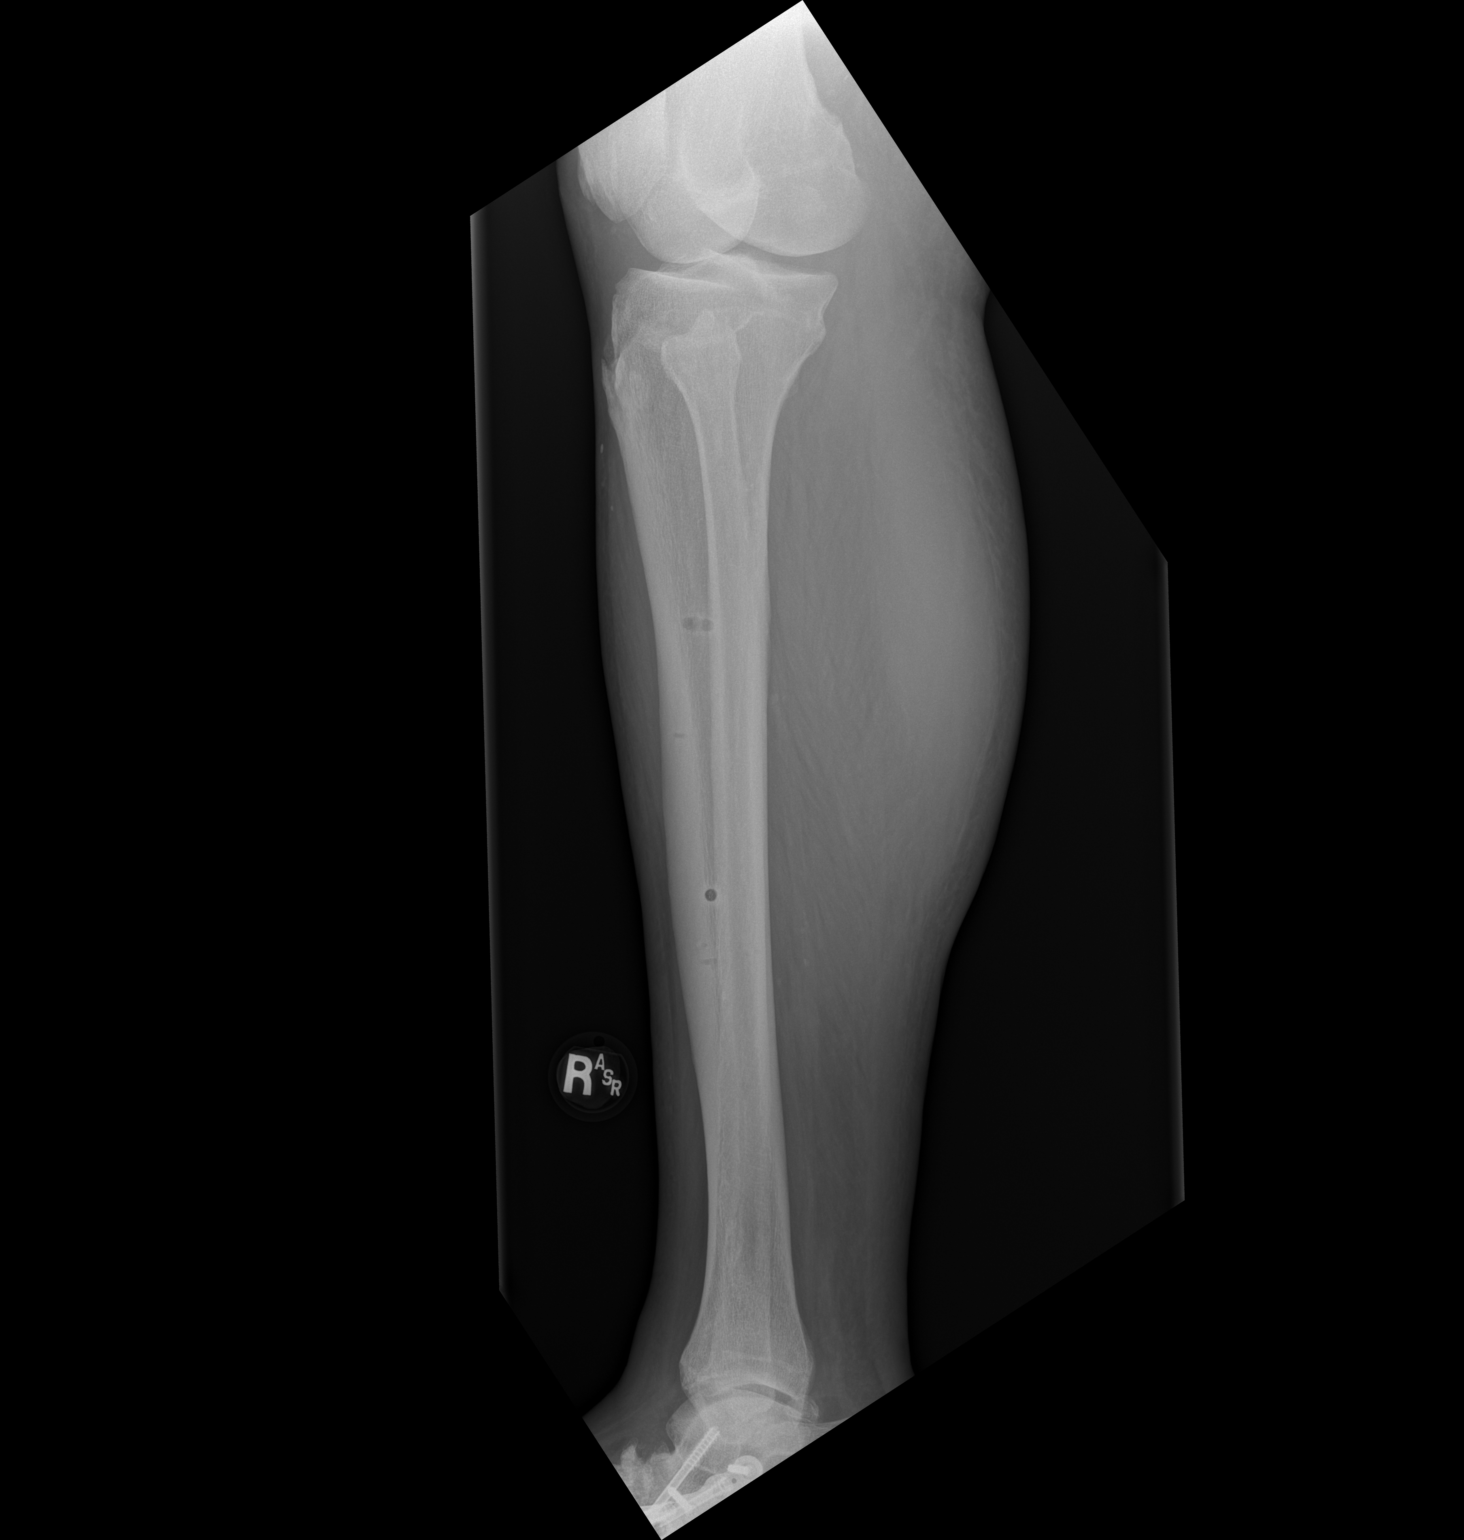

[x tib-fib ap right]
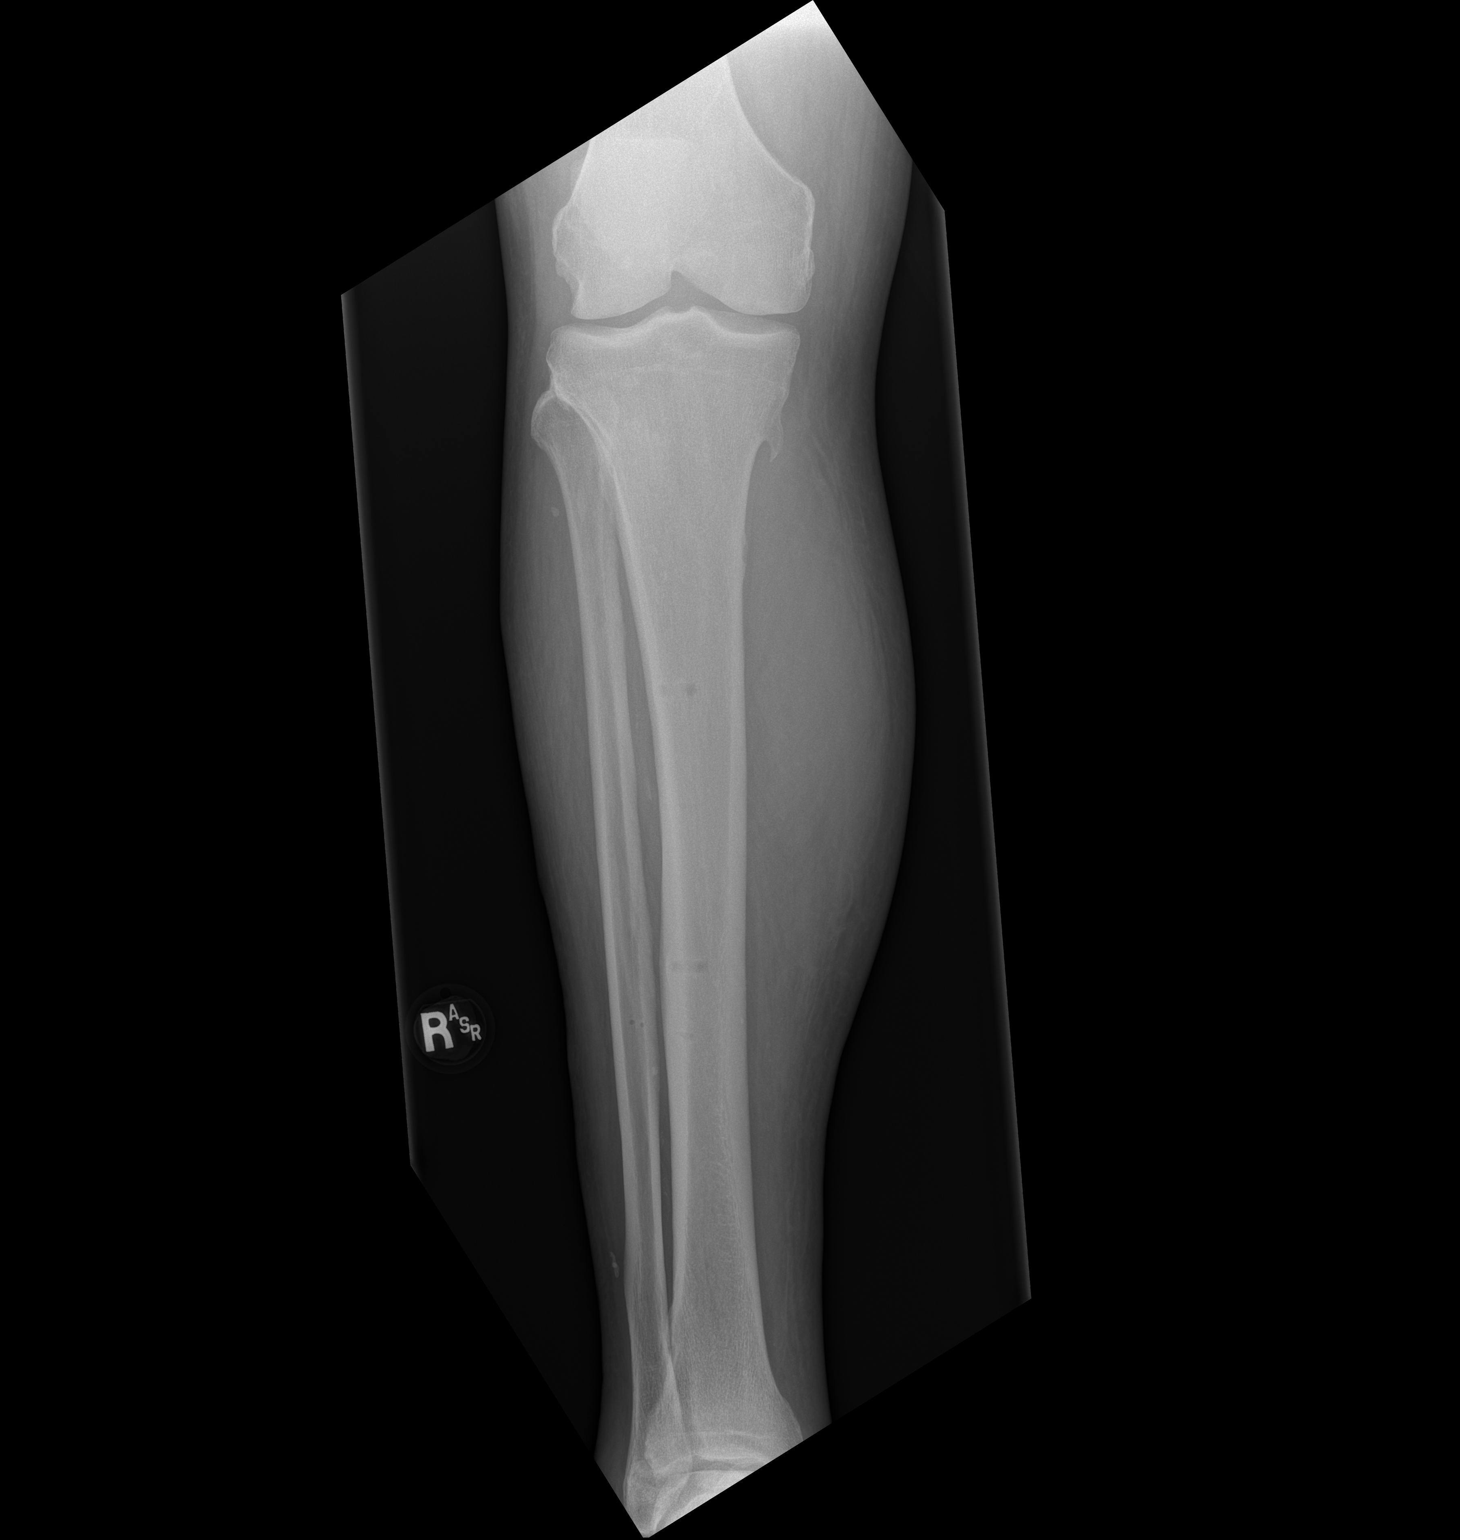

[2 of 2 positions shown; findings below may reference images not displayed]

FINDINGS: Sequelae of previous external fixator device at the tibia. Minimally
visible ORIF hardware in cannulated screws in the posterior foot.
Preserved alignment at the right knee and ankle. Right tibia and
fibula are intact. There is a small medial proximal tibia
osteochondroma, inconsequential in this age group. No discrete soft
tissue injury.
IMPRESSION: 1. No acute fracture or dislocation identified about the right
tib-fib.
2. Sequelae of previous tibia ex-fix device. Minimally visible right
foot ORIF hardware.

## 2022-03-23 IMAGING — DX DG CHEST 1V PORT
1 series · 1 of 1 positions shown · non-contrast
Comparison: 09/20/2020

CLINICAL DATA: Shob / Covid + Evaluate Pneumonia/ pleural effusion
Admitted [DATE] with 5 days of sore throat along with mild cough,
congestion, rhinorrhea, fatigue. He is vaccinated but tested + for
COVID.

EXAM:
PORTABLE CHEST - 1 VIEW

[chest]
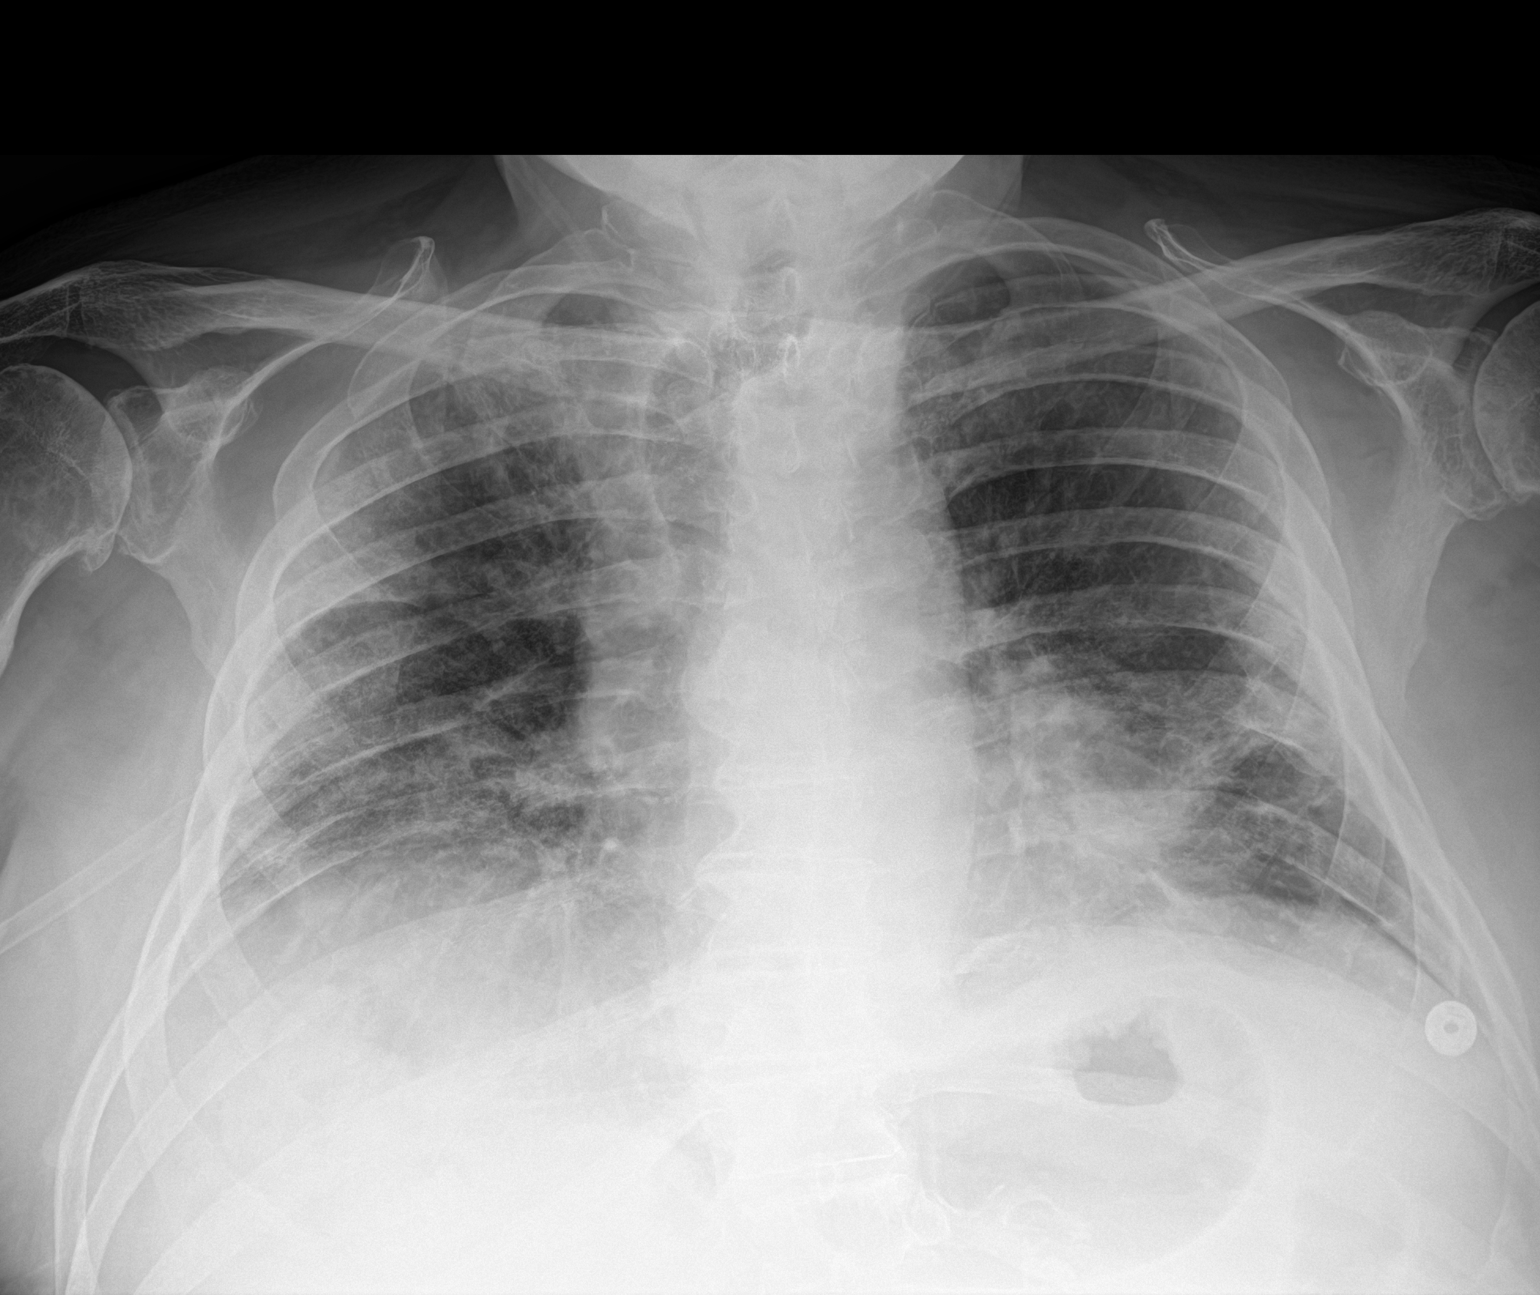

[1 of 1 positions shown; findings below may reference images not displayed]

FINDINGS: Relatively low lung volumes. Progressive peripheral poorly
marginated alveolar opacities throughout the right lung, and at the
left lung base.

Heart size within normal limits. Aortic Atherosclerosis
([id number]-170.0).

No effusion.  No pneumothorax.

Right shoulder DJD.
IMPRESSION: Worsening asymmetric airspace opacities, right greater than left.
